# Patient Record
Sex: Female | Born: 1965 | Race: Asian | Hispanic: No | Marital: Married | Smoking: Never smoker
Health system: Southern US, Community
[De-identification: ages and names within clinical notes are randomized; demographics above are authoritative.]

## PROBLEM LIST (undated history)

## (undated) DIAGNOSIS — I10 Essential (primary) hypertension: Secondary | ICD-10-CM

## (undated) DIAGNOSIS — J3089 Other allergic rhinitis: Secondary | ICD-10-CM

## (undated) DIAGNOSIS — G43909 Migraine, unspecified, not intractable, without status migrainosus: Secondary | ICD-10-CM

## (undated) HISTORY — DX: Essential (primary) hypertension: I10

## (undated) HISTORY — DX: Migraine, unspecified, not intractable, without status migrainosus: G43.909

## (undated) HISTORY — DX: Other allergic rhinitis: J30.89

---

## 2009-12-12 ENCOUNTER — Emergency Department (HOSPITAL_COMMUNITY): Admission: EM | Admit: 2009-12-12 | Discharge: 2009-12-12 | Payer: Self-pay | Admitting: Family Medicine

## 2015-08-15 ENCOUNTER — Ambulatory Visit (INDEPENDENT_AMBULATORY_CARE_PROVIDER_SITE_OTHER): Payer: Managed Care, Other (non HMO) | Admitting: Family Medicine

## 2015-08-15 ENCOUNTER — Ambulatory Visit (INDEPENDENT_AMBULATORY_CARE_PROVIDER_SITE_OTHER): Payer: Managed Care, Other (non HMO)

## 2015-08-15 VITALS — BP 123/77 | HR 71 | Temp 97.8°F | Resp 16 | Ht 59.0 in | Wt 127.0 lb

## 2015-08-15 DIAGNOSIS — M25511 Pain in right shoulder: Secondary | ICD-10-CM

## 2015-08-15 DIAGNOSIS — M542 Cervicalgia: Secondary | ICD-10-CM

## 2015-08-15 DIAGNOSIS — S161XXA Strain of muscle, fascia and tendon at neck level, initial encounter: Secondary | ICD-10-CM | POA: Diagnosis not present

## 2015-08-15 DIAGNOSIS — R300 Dysuria: Secondary | ICD-10-CM

## 2015-08-15 DIAGNOSIS — N912 Amenorrhea, unspecified: Secondary | ICD-10-CM

## 2015-08-15 LAB — POCT URINALYSIS DIP (MANUAL ENTRY)
BILIRUBIN UA: NEGATIVE
Bilirubin, UA: NEGATIVE
GLUCOSE UA: NEGATIVE
Nitrite, UA: NEGATIVE
Protein Ur, POC: NEGATIVE
RBC UA: NEGATIVE
SPEC GRAV UA: 1.025
Urobilinogen, UA: 0.2
pH, UA: 6

## 2015-08-15 LAB — POCT URINE PREGNANCY: Preg Test, Ur: NEGATIVE

## 2015-08-15 LAB — POC MICROSCOPIC URINALYSIS (UMFC): MUCUS RE: ABSENT

## 2015-08-15 MED ORDER — MELOXICAM 7.5 MG PO TABS: 7.5000 mg | ORAL_TABLET | Freq: Every day | ORAL | Status: DC

## 2015-08-15 MED ORDER — METHOCARBAMOL 500 MG PO TABS: 500.0000 mg | ORAL_TABLET | Freq: Three times a day (TID) | ORAL | Status: DC | PRN

## 2015-08-15 MED ORDER — NITROFURANTOIN MONOHYD MACRO 100 MG PO CAPS
100.0000 mg | ORAL_CAPSULE | Freq: Two times a day (BID) | ORAL | Status: DC
Start: 2015-08-15 — End: 2015-12-05

## 2015-08-15 NOTE — Patient Instructions (Addendum)
We recommend that you schedule a mammogram for breast cancer screening. Typically, you do not need a referral to do this. Please contact a local imaging center to schedule your mammogram.  [facility] - [phone]  *ask for the Radiology Department [facility]) - [phone] or [phone]  [facility] - [phone] [facility] - [phone] [facility] - [phone]  *ask for the Radiology Department [facility] - [phone]  *ask for the Radiology Department [facility] - [phone]  *ask for the Mammography Department [facility] - [phone]      IF you received an x-ray today, you will receive an invoice from [facility]. Please contact  Radiology at [phone] with questions or concerns regarding your invoice.   IF you received labwork today, you will receive an invoice from [facility]. Please contact [facility] at [phone] with questions or concerns regarding your invoice.   Our billing staff will not be able to assist you with questions regarding bills from these companies.  You will be contacted with the lab results as soon as they are available. The fastest way to get your results is to activate your My Chart account. Instructions are located on the last page of this paperwork. If you have not heard from us regarding the results in 2 weeks, please contact this office.       Cervical Strain and Sprain With Rehab Cervical strain and sprain are injuries that commonly occur with "whiplash" injuries. Whiplash occurs when the neck is forcefully whipped backward or forward, such as during a motor vehicle accident or during contact sports. The muscles, ligaments, tendons, discs, and nerves of the neck are susceptible to injury when this occurs. RISK FACTORS Risk of having a whiplash injury increases  if:  Osteoarthritis of the spine.  Situations that make head or neck accidents or trauma more likely.  High-risk sports (football, rugby, wrestling, hockey, auto racing, gymnastics, diving, contact karate, or boxing).  Poor strength and flexibility of the neck.  Previous neck injury.  Poor tackling technique.  Improperly fitted or padded equipment. SYMPTOMS   Pain or stiffness in the front or back of neck or both.  Symptoms may present immediately or up to 24 hours after injury.  Dizziness, headache, nausea, and vomiting.  Muscle spasm with soreness and stiffness in the neck.  Tenderness and swelling at the injury site. PREVENTION  Learn and use proper technique (avoid tackling with the head, spearing, and head-butting; use proper falling techniques to avoid landing on the head).  Warm up and stretch properly before activity.  Maintain physical fitness:  Strength, flexibility, and endurance.  Cardiovascular fitness.  Wear properly fitted and padded protective equipment, such as padded soft collars, for participation in contact sports. PROGNOSIS  Recovery from cervical strain and sprain injuries is dependent on the extent of the injury. These injuries are usually curable in 1 week to 3 months with appropriate treatment.  RELATED COMPLICATIONS   Temporary numbness and weakness may occur if the nerve roots are damaged, and this may persist until the nerve has completely healed.  Chronic pain due to frequent recurrence of symptoms.  Prolonged healing, especially if activity is resumed too soon (before complete recovery). TREATMENT  Treatment initially involves the use of ice and medication to help reduce pain and inflammation. It is also important to perform strengthening and stretching exercises and modify activities that worsen symptoms so   the injury does not get worse. These exercises may be performed at home or with a therapist. For patients who experience severe  symptoms, a soft, padded collar may be recommended to be worn around the neck.  Improving your posture may help reduce symptoms. Posture improvement includes pulling your chin and abdomen in while sitting or standing. If you are sitting, sit in a firm chair with your buttocks against the back of the chair. While sleeping, try replacing your pillow with a small towel rolled to 2 inches in diameter, or use a cervical pillow or soft cervical collar. Poor sleeping positions delay healing.  For patients with nerve root damage, which causes numbness or weakness, the use of a cervical traction apparatus may be recommended. Surgery is rarely necessary for these injuries. However, cervical strain and sprains that are present at birth (congenital) may require surgery. MEDICATION   If pain medication is necessary, nonsteroidal anti-inflammatory medications, such as aspirin and ibuprofen, or other minor pain relievers, such as acetaminophen, are often recommended.  Do not take pain medication for 7 days before surgery.  Prescription pain relievers may be given if deemed necessary by your caregiver. Use only as directed and only as much as you need. HEAT AND COLD:   Cold treatment (icing) relieves pain and reduces inflammation. Cold treatment should be applied for 10 to 15 minutes every 2 to 3 hours for inflammation and pain and immediately after any activity that aggravates your symptoms. Use ice packs or an ice massage.  Heat treatment may be used prior to performing the stretching and strengthening activities prescribed by your caregiver, physical therapist, or athletic trainer. Use a heat pack or a warm soak. SEEK MEDICAL CARE IF:   Symptoms get worse or do not improve in 2 weeks despite treatment.  New, unexplained symptoms develop (drugs used in treatment may produce side effects). EXERCISES RANGE OF MOTION (ROM) AND STRETCHING EXERCISES - Cervical Strain and Sprain These exercises may help you when  beginning to rehabilitate your injury. In order to successfully resolve your symptoms, you must improve your posture. These exercises are designed to help reduce the forward-head and rounded-shoulder posture which contributes to this condition. Your symptoms may resolve with or without further involvement from your physician, physical therapist or athletic trainer. While completing these exercises, remember:   Restoring tissue flexibility helps normal motion to return to the joints. This allows healthier, less painful movement and activity.  An effective stretch should be held for at least 20 seconds, although you may need to begin with shorter hold times for comfort.  A stretch should never be painful. You should only feel a gentle lengthening or release in the stretched tissue. STRETCH- Axial Extensors  Lie on your back on the floor. You may bend your knees for comfort. Place a rolled-up hand towel or dish towel, about 2 inches in diameter, under the part of your head that makes contact with the floor.  Gently tuck your chin, as if trying to make a "double chin," until you feel a gentle stretch at the base of your head.  Hold __________ seconds. Repeat __________ times. Complete this exercise __________ times per day.  STRETCH - Axial Extension   Stand or sit on a firm surface. Assume a good posture: chest up, shoulders drawn back, abdominal muscles slightly tense, knees unlocked (if standing) and feet hip width apart.  Slowly retract your chin so your head slides back and your chin slightly lowers. Continue to look straight ahead.    You should feel a gentle stretch in the back of your head. Be certain not to feel an aggressive stretch since this can cause headaches later.  Hold for __________ seconds. Repeat __________ times. Complete this exercise __________ times per day. STRETCH - Cervical Side Bend   Stand or sit on a firm surface. Assume a good posture: chest up, shoulders drawn  back, abdominal muscles slightly tense, knees unlocked (if standing) and feet hip width apart.  Without letting your nose or shoulders move, slowly tip your right / left ear to your shoulder until your feel a gentle stretch in the muscles on the opposite side of your neck.  Hold __________ seconds. Repeat __________ times. Complete this exercise __________ times per day. STRETCH - Cervical Rotators   Stand or sit on a firm surface. Assume a good posture: chest up, shoulders drawn back, abdominal muscles slightly tense, knees unlocked (if standing) and feet hip width apart.  Keeping your eyes level with the ground, slowly turn your head until you feel a gentle stretch along the back and opposite side of your neck.  Hold __________ seconds. Repeat __________ times. Complete this exercise __________ times per day. RANGE OF MOTION - Neck Circles   Stand or sit on a firm surface. Assume a good posture: chest up, shoulders drawn back, abdominal muscles slightly tense, knees unlocked (if standing) and feet hip width apart.  Gently roll your head down and around from the back of one shoulder to the back of the other. The motion should never be forced or painful.  Repeat the motion 10-20 times, or until you feel the neck muscles relax and loosen. Repeat __________ times. Complete the exercise __________ times per day. STRENGTHENING EXERCISES - Cervical Strain and Sprain These exercises may help you when beginning to rehabilitate your injury. They may resolve your symptoms with or without further involvement from your physician, physical therapist, or athletic trainer. While completing these exercises, remember:   Muscles can gain both the endurance and the strength needed for everyday activities through controlled exercises.  Complete these exercises as instructed by your physician, physical therapist, or athletic trainer. Progress the resistance and repetitions only as guided.  You may  experience muscle soreness or fatigue, but the pain or discomfort you are trying to eliminate should never worsen during these exercises. If this pain does worsen, stop and make certain you are following the directions exactly. If the pain is still present after adjustments, discontinue the exercise until you can discuss the trouble with your clinician. STRENGTH - Cervical Flexors, Isometric  Face a wall, standing about 6 inches away. Place a small pillow, a ball about 6-8 inches in diameter, or a folded towel between your forehead and the wall.  Slightly tuck your chin and gently push your forehead into the soft object. Push only with mild to moderate intensity, building up tension gradually. Keep your jaw and forehead relaxed.  Hold 10 to 20 seconds. Keep your breathing relaxed.  Release the tension slowly. Relax your neck muscles completely before you start the next repetition. Repeat __________ times. Complete this exercise __________ times per day. STRENGTH- Cervical Lateral Flexors, Isometric   Stand about 6 inches away from a wall. Place a small pillow, a ball about 6-8 inches in diameter, or a folded towel between the side of your head and the wall.  Slightly tuck your chin and gently tilt your head into the soft object. Push only with mild to moderate intensity, building up tension gradually.   Keep your jaw and forehead relaxed.  Hold 10 to 20 seconds. Keep your breathing relaxed.  Release the tension slowly. Relax your neck muscles completely before you start the next repetition. Repeat __________ times. Complete this exercise __________ times per day. STRENGTH - Cervical Extensors, Isometric   Stand about 6 inches away from a wall. Place a small pillow, a ball about 6-8 inches in diameter, or a folded towel between the back of your head and the wall.  Slightly tuck your chin and gently tilt your head back into the soft object. Push only with mild to moderate intensity, building up  tension gradually. Keep your jaw and forehead relaxed.  Hold 10 to 20 seconds. Keep your breathing relaxed.  Release the tension slowly. Relax your neck muscles completely before you start the next repetition. Repeat __________ times. Complete this exercise __________ times per day. POSTURE AND BODY MECHANICS CONSIDERATIONS - Cervical Strain and Sprain Keeping correct posture when sitting, standing or completing your activities will reduce the stress put on different body tissues, allowing injured tissues a chance to heal and limiting painful experiences. The following are general guidelines for improved posture. Your physician or physical therapist will provide you with any instructions specific to your needs. While reading these guidelines, remember:  The exercises prescribed by your provider will help you have the flexibility and strength to maintain correct postures.  The correct posture provides the optimal environment for your joints to work. All of your joints have less wear and tear when properly supported by a spine with good posture. This means you will experience a healthier, less painful body.  Correct posture must be practiced with all of your activities, especially prolonged sitting and standing. Correct posture is as important when doing repetitive low-stress activities (typing) as it is when doing a single heavy-load activity (lifting). PROLONGED STANDING WHILE SLIGHTLY LEANING FORWARD When completing a task that requires you to lean forward while standing in one place for a long time, place either foot up on a stationary 2- to 4-inch high object to help maintain the best posture. When both feet are on the ground, the low back tends to lose its slight inward curve. If this curve flattens (or becomes too large), then the back and your other joints will experience too much stress, fatigue more quickly, and can cause pain.  RESTING POSITIONS Consider which positions are most painful for  you when choosing a resting position. If you have pain with flexion-based activities (sitting, bending, stooping, squatting), choose a position that allows you to rest in a less flexed posture. You would want to avoid curling into a fetal position on your side. If your pain worsens with extension-based activities (prolonged standing, working overhead), avoid resting in an extended position such as sleeping on your stomach. Most people will find more comfort when they rest with their spine in a more neutral position, neither too rounded nor too arched. Lying on a non-sagging bed on your side with a pillow between your knees, or on your back with a pillow under your knees will often provide some relief. Keep in mind, being in any one position for a prolonged period of time, no matter how correct your posture, can still lead to stiffness. WALKING Walk with an upright posture. Your ears, shoulders, and hips should all line up. OFFICE WORK When working at a desk, create an environment that supports good, upright posture. Without extra support, muscles fatigue and lead to excessive strain on joints and other   tissues. CHAIR:  A chair should be able to slide under your desk when your back makes contact with the back of the chair. This allows you to work closely.  The chair's height should allow your eyes to be level with the upper part of your monitor and your hands to be slightly lower than your elbows.  Body position:  Your feet should make contact with the floor. If this is not possible, use a foot rest.  Keep your ears over your shoulders. This will reduce stress on your neck and low back.   This information is not intended to replace advice given to you by your health care provider. Make sure you discuss any questions you have with your health care provider.   Document Released: 03/17/2005 Document Revised: 04/07/2014 Document Reviewed: 06/29/2008 Elsevier Interactive Patient Education 2016  Elsevier Inc[employer]  

## 2015-08-15 NOTE — Progress Notes (Signed)
Subjective:    Patient ID: Isabel QuarryAdeliza B Jupin, female    DOB: Dec 06, 1965, 50 y.o.   MRN: [id number]  08/15/2015  Neck Pain and Shoulder Pain   HPI This 50 y.o. female presents for evaluation of R posterior neck and shoudler pain.  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  Onset one week ago.  No radiation into arms.  No radiation into arms.  +tingling B hands chronically.  Applying heat.    LMP 7 months ago.  [facility]   Dysuria: onset one month ago.  Hematuria.  No flank pain; no n/v; +suprapubic pain.  No vaginal discharge or vaginal irritation; ;married.    Review of Systems  Constitutional: Negative for fever, chills, diaphoresis and fatigue.  Eyes: Negative for visual disturbance.  Respiratory: Negative for cough and shortness of breath.   Cardiovascular: Negative for chest pain, palpitations and leg swelling.  Gastrointestinal: Negative for nausea, vomiting, abdominal pain, diarrhea and constipation.  Endocrine: Negative for cold intolerance, heat intolerance, polydipsia, polyphagia and polyuria.  Genitourinary: Positive for dysuria, urgency and hematuria. Negative for frequency, flank pain, vaginal discharge and vaginal pain.  Musculoskeletal: Positive for myalgias, neck pain and neck stiffness.  Neurological: Negative for dizziness, tremors, seizures, syncope, facial asymmetry, speech difficulty, weakness, light-headedness, numbness and headaches.    History reviewed. No pertinent past medical history. Past Surgical History  Procedure Laterality Date  . Cesarean section     No Known Allergies  Social History   Social History  . Marital Status: Married    Spouse Name: N/A  . Number of Children: N/A  . Years of Education: N/A   Occupational History  . Not on file.   Social History Main Topics  . Smoking status: Never Smoker   . Smokeless tobacco: Not on file  . Alcohol Use: 0.0 oz/week    0 Standard drinks or equivalent per  week  . Drug Use: No  . Sexual Activity: Not on file   Other Topics Concern  . Not on file   Social History Narrative  . No narrative on file   History reviewed. No pertinent family history.     Objective:    BP 123/77 mmHg  Pulse 71  Temp(Src) 97.8 F (36.6 C)  Resp 16  Ht 4\' 11"  (1.499 m)  Wt 127 lb (57.607 kg)  BMI 25.64 kg/m2 Physical Exam  Constitutional: She is oriented to person, place, and time. She appears well-developed and well-nourished. No distress.  HENT:  Head: Normocephalic and atraumatic.  Right Ear: External ear normal.  Left Ear: External ear normal.  Nose: Nose normal.  Mouth/Throat: Oropharynx is clear and moist.  Eyes: Conjunctivae and EOM are normal. Pupils are equal, round, and reactive to light.  Neck: Normal range of motion. Neck supple. Carotid bruit is not present. No thyromegaly present.  Cardiovascular: Normal rate, regular rhythm, normal heart sounds and intact distal pulses.  Exam reveals no gallop and no friction rub.   No murmur heard. Pulmonary/Chest: Effort normal and breath sounds normal. She has no wheezes. She has no rales.  Abdominal: Soft. Bowel sounds are normal. She exhibits no distension and no mass. There is no tenderness. There is no rebound and no guarding.  Musculoskeletal:       Right shoulder: She exhibits normal range of motion, no tenderness, no bony tenderness, no swelling, no pain, no spasm, normal pulse and normal strength.       Cervical back: She exhibits decreased  range of motion, tenderness, pain and spasm. She exhibits no bony tenderness and normal pulse.  Lymphadenopathy:    She has no cervical adenopathy.  Neurological: She is alert and oriented to person, place, and time. No cranial nerve deficit.  Skin: Skin is warm and dry. No rash noted. She is not diaphoretic. No erythema. No pallor.  Psychiatric: She has a normal mood and affect. Her behavior is normal.        Assessment & Plan:   1. Neck pain on  right side   2. Pain in joint of right shoulder   3. Amenorrhea   4. Dysuria   5. Neck strain, initial encounter     Orders Placed This Encounter  Procedures  . Urine culture  . DG Cervical Spine Complete    Standing Status: Future     Number of Occurrences: 1     Standing Expiration Date: 08/14/2016    Order Specific Question:  Reason for Exam (SYMPTOM  OR DIAGNOSIS REQUIRED)    Answer:  R posterior neck pain and R posterior shoulder pain    Order Specific Question:  Is the patient pregnant?    Answer:  No    Order Specific Question:  Preferred imaging location?    Answer:  External  . DG Shoulder Right    Standing Status: Future     Number of Occurrences: 1     Standing Expiration Date: 08/14/2016    Order Specific Question:  Reason for Exam (SYMPTOM  OR DIAGNOSIS REQUIRED)    Answer:  R posterior neck pain and R posterior shoulder pain    Order Specific Question:  Is the patient pregnant?    Answer:  No    Order Specific Question:  Preferred imaging location?    Answer:  External  . POCT urine pregnancy  . POCT urinalysis dipstick  . POCT Microscopic Urinalysis ([facility])   Meds ordered this encounter  Medications  . lisinopril (PRINIVIL,ZESTRIL) 10 MG tablet    Sig: Take 10 mg by mouth daily.  . butalbital-acetaminophen-caffeine (FIORICET WITH CODEINE) 50-325-40-30 MG capsule    Sig: Take 1 capsule by mouth every 4 (four) hours as needed for headache.  . nitrofurantoin, macrocrystal-monohydrate, (MACROBID) 100 MG capsule    Sig: Take 1 capsule (100 mg total) by mouth 2 (two) times daily.    Dispense:  14 capsule    Refill:  0  . methocarbamol (ROBAXIN) 500 MG tablet    Sig: Take 1-2 tablets (500-1,000 mg total) by mouth every 8 (eight) hours as needed for muscle spasms.    Dispense:  30 tablet    Refill:  0  . meloxicam (MOBIC) 7.5 MG tablet    Sig: Take 1 tablet (7.5 mg total) by mouth daily.    Dispense:  30 tablet    Refill:  0    No Follow-up on  file.    Olesya Wike Paulita Fujita, M.D. [facility] [address] [place], [place]  [address] [phone] phone [phone] fax

## 2015-08-19 LAB — URINE CULTURE

## 2015-09-11 ENCOUNTER — Other Ambulatory Visit: Payer: Self-pay | Admitting: Family Medicine

## 2015-09-13 NOTE — Telephone Encounter (Signed)
Do you want to give RFs? 

## 2015-09-14 NOTE — Telephone Encounter (Signed)
One month supply of Meloxicam provided; will need referral or follow-up appointment for further refills.  Please advise patient.

## 2015-12-05 ENCOUNTER — Encounter: Payer: Self-pay | Admitting: Family Medicine

## 2015-12-05 ENCOUNTER — Ambulatory Visit (INDEPENDENT_AMBULATORY_CARE_PROVIDER_SITE_OTHER): Payer: Managed Care, Other (non HMO) | Admitting: Family Medicine

## 2015-12-05 VITALS — BP 120/74 | HR 62 | Temp 97.8°F | Resp 16 | Ht <= 58 in | Wt 127.8 lb

## 2015-12-05 DIAGNOSIS — IMO0001 Reserved for inherently not codable concepts without codable children: Secondary | ICD-10-CM

## 2015-12-05 DIAGNOSIS — H6002 Abscess of left external ear: Secondary | ICD-10-CM | POA: Diagnosis not present

## 2015-12-05 DIAGNOSIS — R51 Headache: Secondary | ICD-10-CM

## 2015-12-05 DIAGNOSIS — R05 Cough: Secondary | ICD-10-CM

## 2015-12-05 DIAGNOSIS — H9202 Otalgia, left ear: Secondary | ICD-10-CM

## 2015-12-05 DIAGNOSIS — T814XXA Infection following a procedure, initial encounter: Secondary | ICD-10-CM

## 2015-12-05 DIAGNOSIS — J069 Acute upper respiratory infection, unspecified: Secondary | ICD-10-CM

## 2015-12-05 DIAGNOSIS — R519 Headache, unspecified: Secondary | ICD-10-CM

## 2015-12-05 MED ORDER — GUAIFENESIN ER 1200 MG PO TB12: 1.0000 | ORAL_TABLET | Freq: Two times a day (BID) | ORAL | 1 refills | Status: DC | PRN

## 2015-12-05 MED ORDER — TIZANIDINE HCL 6 MG PO CAPS: 6.0000 mg | ORAL_CAPSULE | Freq: Three times a day (TID) | ORAL | 1 refills | Status: DC

## 2015-12-05 MED ORDER — DOXYCYCLINE HYCLATE 100 MG PO CAPS: 100.0000 mg | ORAL_CAPSULE | Freq: Two times a day (BID) | ORAL | 0 refills | Status: DC

## 2015-12-05 MED ORDER — BENZONATATE 100 MG PO CAPS: 100.0000 mg | ORAL_CAPSULE | Freq: Three times a day (TID) | ORAL | 0 refills | Status: DC | PRN

## 2015-12-05 MED ORDER — TOPIRAMATE 25 MG PO TABS: 50.0000 mg | ORAL_TABLET | Freq: Two times a day (BID) | ORAL | Status: DC

## 2015-12-05 NOTE — Patient Instructions (Addendum)
Start Topiramate for headache prevention at 25 mg at bedtime times 1 week. Increase to 50 mg (2 tablets) at bedtime until further evaluated by headache specialist.  Take tizanidine 6 mg up to 3 times daily as needed for headache.  Ear abscess Start Doxycycline 100 mg twice daily for 10 days.  Return for follow-up in 7 days to re-evaluate ear.   Cough and respiratory illness: . . benzonatate (TESSALON) 100 MG capsule    Sig: Take 1-2 capsules (100-200 mg total) by mouth 3 (three) times daily as needed for cough.    Dispense:  40 capsule  . Guaifenesin (MUCINEX MAXIMUM STRENGTH) 1200 MG TB12    Sig: Take 1 tablet (1,200 mg total) by mouth every 12 (twelve) hours as needed.   Return for follow-up in 4 weeks.  IF you received an x-ray today, you will receive an invoice from [facility]. Please contact [facility] at [phone] with questions or concerns regarding your invoice.   IF you received labwork today, you will receive an invoice from [employer]. Please contact [facility] at [phone] with questions or concerns regarding your invoice.   Our billing staff will not be able to assist you with questions regarding bills from these companies.  You will be contacted with the lab results as soon as they are available. The fastest way to get your results is to activate your My Chart account. Instructions are located on the last page of this paperwork. If you have not heard from [place] regarding the results in 2 weeks, please contact this office.    General Headache Without Cause A headache is pain or discomfort felt around the head or neck area. The specific cause of a headache may not be found. There are many causes and types of headaches. A few common ones are:  Tension headaches.  Migraine headaches.  Cluster headaches.  Chronic daily headaches. HOME CARE INSTRUCTIONS  Watch your condition for any changes. Take these steps to help with  your condition: Managing Pain  Take over-the-counter and prescription medicines only as told by your health care provider.  Lie down in a dark, quiet room when you have a headache.  If directed, apply ice to the head and neck area:  Put ice in a plastic bag.  Place a towel between your skin and the bag.  Leave the ice on for 20 minutes, 2-3 times per day.  Use a heating pad or hot shower to apply heat to the head and neck area as told by your health care provider.  Keep lights dim if bright lights bother you or make your headaches worse. Eating and Drinking  Eat meals on a regular schedule.  Limit alcohol use.  Decrease the amount of caffeine you drink, or stop drinking caffeine. General Instructions  Keep all follow-up visits as told by your health care provider. This is important.  Keep a headache journal to help find out what may trigger your headaches. For example, write down:  What you eat and drink.  How much sleep you get.  Any change to your diet or medicines.  Try massage or other relaxation techniques.  Limit stress.  Sit up straight, and do not tense your muscles.  Do not use tobacco products, including cigarettes, chewing tobacco, or e-cigarettes. If you need help quitting, ask your health care provider.  Exercise regularly as told by your health care provider.  Sleep on a regular schedule. Get 7-9 hours of sleep, or the amount recommended  by your health care provider. SEEK MEDICAL CARE IF:   Your symptoms are not helped by medicine.  You have a headache that is different from the usual headache.  You have nausea or you vomit.  You have a fever. SEEK IMMEDIATE MEDICAL CARE IF:   Your headache becomes severe.  You have repeated vomiting.  You have a stiff neck.  You have a loss of vision.  You have problems with speech.  You have pain in the eye or ear.  You have muscular weakness or loss of muscle control.  You lose your balance or  have trouble walking.  You feel faint or pass out.  You have confusion.   This information is not intended to replace advice given to you by your health care provider. Make sure you discuss any questions you have with your health care provider.   Document Released: 03/17/2005 Document Revised: 12/06/2014 Document Reviewed: 07/10/2014 Elsevier Interactive Patient Education [employer]2016 Elsevier Inc.

## 2015-12-05 NOTE — Progress Notes (Signed)
Patient ID: Isabel [name], female    DOB: 09/08/1965, 50 y.o.   MRN: [id number]  PCP: No PCP Per Patient  Chief Complaint  Patient presents with  . Other    left earache  started 12/04/15  . Cough    x 2 days  . Medication Refill    Triamcinolone 0.5%, Butalbital/Ace/Caff    Subjective:   HPI 50 year-old female presents for evaluation of coughing, headache, and left ear pain.  Cough Times three days. She hasn't taken any medicaiton  Sore and  Itchy throat Non-productive, dry, cough. Some shortness of breath and feeling fatigued.  Used water with lemon.  Reports subjective fever.  Headaches Two year history of headaches Average number of headaches per month 3-4 times. Forehead and occipital region.  Noticed at times when she experiences headaches when her blood pressure is elevated. She has never been evaluated for headaches.  Ear Pain Two days of soreness of outer ear canal around pimple in left ear.   Review of Systems  HENT: Positive for ear pain.        Left ear pustule superior to ear canal  Respiratory: Positive for cough.   Cardiovascular: Negative.   Neurological: Positive for headaches. Negative for dizziness.       SEE HPI   There are no active problems to display for this patient.    Prior to Admission medications   Medication Sig Start Date End Date Taking? Authorizing Provider  butalbital-acetaminophen-caffeine (FIORICET WITH CODEINE) 50-325-40-30 MG capsule Take 1 capsule by mouth every 4 (four) hours as needed for headache.   Yes Historical Provider, MD  lisinopril (PRINIVIL,ZESTRIL) 10 MG tablet Take 10 mg by mouth daily.   Yes Historical Provider, MD  meloxicam (MOBIC) 7.5 MG tablet TAKE 1 TABLET(7.5 MG) BY MOUTH DAILY 09/14/15  Yes Ethelda Chick, MD  methocarbamol (ROBAXIN) 500 MG tablet Take 1-2 tablets (500-1,000 mg total) by mouth every 8 (eight) hours as needed for muscle spasms. 08/15/15  Yes Ethelda Chick, MD     No Known Allergies    Objective:  Physical Exam  Constitutional: She is oriented to person, place, and time. She appears well-developed and well-nourished.  HENT:  Head: Normocephalic and atraumatic.  Right Ear: External ear normal.  Left Ear: External ear normal.  Nose: Nose normal.  Mouth/Throat: Oropharynx is clear and moist.  Drained pustule abscess-Verbal informed consent obtained.  Local anesthesia used 1% without epinephrine. Cleaned with alcohol swab.Prepped with betadine  Incised with # 11, drained purulent exudate. Wound culture obtained.  Eyes: Conjunctivae and EOM are normal. Pupils are equal, round, and reactive to light.  Neck: Normal range of motion.  Cardiovascular: Normal rate, regular rhythm, normal heart sounds and intact distal pulses.   Pulmonary/Chest: Effort normal and breath sounds normal.  Musculoskeletal: Normal range of motion.  Neurological: She is alert and oriented to person, place, and time.  Skin: Skin is warm and dry.  Psychiatric: She has a normal mood and affect. Her behavior is normal. Judgment and thought content normal.    Vitals:   12/05/15 1037  BP: 120/74  Pulse: 62  Resp: 16  Temp: 97.8 F (36.6 C)   Assessment & Plan:  1. Chronic intractable headache, unspecified headache type, unknown etiology.  -Start topiramate 50 mg 2 times daily. -tizanidine 6 mg, up to 3 times daily as needed. - AMB referral to headache clinic Follow-up in weeks for re-evaluation of treatment.  2. Wound abscess, initial encounter, in  left ear, vesicular, pustule, drained of purulent drainage. Patient tolerated. - Wound culture  Plan: -Treat with antibiotic prophylaxis against staph infection Doxycyline 100 mg twice daily, 10 days.  3. Viral upper respiratory illness, treat symptomatically. Plan: -Guaifenesin 1200 mg, twice daily -Benzonatate 100 mg-200 mg 3 times daily as needed for cough.  Godfrey PickKimberly S. Tiburcio PeaHarris, MSN, FNP-C [facility]

## 2015-12-08 LAB — WOUND CULTURE
GRAM STAIN: NONE SEEN
GRAM STAIN: NONE SEEN
Gram Stain: NONE SEEN
Organism ID, Bacteria: NORMAL

## 2015-12-28 ENCOUNTER — Ambulatory Visit (INDEPENDENT_AMBULATORY_CARE_PROVIDER_SITE_OTHER): Payer: Managed Care, Other (non HMO) | Admitting: Family Medicine

## 2015-12-28 ENCOUNTER — Ambulatory Visit (INDEPENDENT_AMBULATORY_CARE_PROVIDER_SITE_OTHER): Payer: Managed Care, Other (non HMO)

## 2015-12-28 VITALS — BP 124/80 | HR 77 | Temp 98.6°F | Resp 17 | Ht <= 58 in | Wt 123.0 lb

## 2015-12-28 DIAGNOSIS — R071 Chest pain on breathing: Secondary | ICD-10-CM

## 2015-12-28 DIAGNOSIS — R072 Precordial pain: Secondary | ICD-10-CM

## 2015-12-28 DIAGNOSIS — R0789 Other chest pain: Secondary | ICD-10-CM

## 2015-12-28 LAB — POCT CBC
Granulocyte percent: 54 %G (ref 37–80)
HCT, POC: 38.5 % (ref 37.7–47.9)
HEMOGLOBIN: 13.7 g/dL (ref 12.2–16.2)
LYMPH, POC: 1.9 (ref 0.6–3.4)
MCH: 32 pg — AB (ref 27–31.2)
MCHC: 35.6 g/dL — AB (ref 31.8–35.4)
MCV: 90.1 fL (ref 80–97)
MID (cbc): 0.3 (ref 0–0.9)
MPV: 7 fL (ref 0–99.8)
PLATELET COUNT, POC: 209 10*3/uL (ref 142–424)
POC Granulocyte: 2.5 (ref 2–6.9)
POC LYMPH PERCENT: 39.6 %L (ref 10–50)
POC MID %: 6.7 %M (ref 0–12)
RBC: 4.27 M/uL (ref 4.04–5.48)
RDW, POC: 12.5 %
WBC: 4.7 10*3/uL (ref 4.6–10.2)

## 2015-12-28 LAB — POCT SEDIMENTATION RATE: POCT SED RATE: 21 mm/h (ref 0–22)

## 2015-12-28 MED ORDER — PREDNISONE 10 MG PO TABS: ORAL_TABLET | ORAL | 0 refills | Status: DC

## 2015-12-28 MED ORDER — BENZONATATE 200 MG PO CAPS: 200.0000 mg | ORAL_CAPSULE | Freq: Three times a day (TID) | ORAL | 0 refills | Status: DC | PRN

## 2015-12-28 MED ORDER — MELOXICAM 7.5 MG PO TABS: 7.5000 mg | ORAL_TABLET | Freq: Every day | ORAL | 0 refills | Status: DC

## 2015-12-28 NOTE — Progress Notes (Signed)
By signing my name below I, Shelah Lewandowsky, attest that this documentation has been prepared under the direction and in the presence of Norberto Sorenson, MD. Electonically Signed. Shelah Lewandowsky, Scribe 12/28/2015 at 4:17 PM   Subjective:    Patient ID: Isabel [name], female    DOB: 1965-07-16, 50 y.o.   MRN: [id number]  Chief Complaint  Patient presents with  . Chest Pain    x 5 days     HPI Isabel [name] is a 50 y.o. female who presents to the [facility] complaining of a sharp stabbing substernal CP for the past 5 days. CP episodes will last for hours at a time. Pt has been taking aspirin with no relief. CP is exacerbated with deep breaths and movement. Upon deep breaths pt "feels chest muscles tight and moving." CP radiates to under left scapula. Pt denies any SOB, diaphoresis, nausea, GERD, abd pain, difficulty swallowing, or anxiety.   Pt has robaxin and mobic that pt has not been taking since initial onset of symptoms.   Pt reports recent cough and was seen and evaluated at [facility] on 12/05/15 and treated with 10 days doxycycline. Pt has had mild residual dry cough since treatment that has been improving.  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     There are no active problems to display for this patient.   Current Outpatient Prescriptions on File Prior to Visit  Medication Sig Dispense Refill  . benzonatate (TESSALON) 100 MG capsule Take 1-2 capsules (100-200 mg total) by mouth 3 (three) times daily as needed for cough. 40 capsule 0  . butalbital-acetaminophen-caffeine (FIORICET WITH CODEINE) 50-325-40-30 MG capsule Take 1 capsule by mouth every 4 (four) hours as needed for headache.    . doxycycline (VIBRAMYCIN) 100 MG capsule Take 1 capsule (100 mg total) by mouth 2 (two) times daily. 20 capsule 0  . Guaifenesin (MUCINEX MAXIMUM STRENGTH) 1200 MG TB12 Take 1 tablet (1,200 mg total) by mouth every 12 (twelve) hours as needed. 14  tablet 1  . lisinopril (PRINIVIL,ZESTRIL) 10 MG tablet Take 10 mg by mouth daily.    . meloxicam (MOBIC) 7.5 MG tablet TAKE 1 TABLET(7.5 MG) BY MOUTH DAILY 30 tablet 0  . methocarbamol (ROBAXIN) 500 MG tablet Take 1-2 tablets (500-1,000 mg total) by mouth every 8 (eight) hours as needed for muscle spasms. 30 tablet 0  . tizanidine (ZANAFLEX) 6 MG capsule Take 1 capsule (6 mg total) by mouth 3 (three) times daily. 30 capsule 1  . topiramate (TOPAMAX) 25 MG tablet Take 2 tablets (50 mg total) by mouth 2 (two) times daily. 60 tablet o   No current facility-administered medications on file prior to visit.     No Known Allergies  Depression screen [facility] 2/9 12/28/2015 12/05/2015 08/15/2015  Decreased Interest 0 0 0  Down, Depressed, Hopeless - 0 0  PHQ - 2 Score 0 0 0      Review of Systems  Constitutional: Negative.   HENT: Negative.   Eyes: Negative.   Respiratory: Positive for chest tightness. Negative for shortness of breath.   Cardiovascular: Positive for chest pain.  Gastrointestinal: Negative.   Genitourinary: Negative.   Musculoskeletal: Negative.   Skin: Negative.   Neurological: Negative for dizziness, light-headedness and headaches.  Psychiatric/Behavioral: Negative.        Objective:   Physical Exam  Constitutional: She is oriented to person, place, and time. She appears well-developed  and well-nourished. No distress.  HENT:  Head: Normocephalic and atraumatic.  Eyes: Conjunctivae are normal. Pupils are equal, round, and reactive to light.  Neck: Neck supple.  Cardiovascular: Normal rate, regular rhythm and normal heart sounds.  Exam reveals no gallop and no friction rub.   No murmur heard. Pulmonary/Chest: Effort normal and breath sounds normal. No accessory muscle usage. No respiratory distress. She has no decreased breath sounds. She has no wheezes. She has no rhonchi. She has no rales. She exhibits tenderness (costosternal junction, symptoms reproduced w/ palpation).    Abdominal: Soft. Normal appearance and bowel sounds are normal. She exhibits no distension. There is no hepatosplenomegaly. There is tenderness (mild) in the epigastric area. There is no rebound, no guarding, no CVA tenderness, no tenderness at McBurney's point and negative Murphy's sign.  Musculoskeletal: Normal range of motion.  Neurological: She is alert and oriented to person, place, and time.  Skin: Skin is warm and dry.  Psychiatric: She has a normal mood and affect. Her behavior is normal.  Nursing note and vitals reviewed.  BP 124/80 (BP Location: Right Arm, Patient Position: Sitting, Cuff Size: Normal)   Pulse 77   Temp 98.6 F (37 C) (Oral)   Resp 17   Ht 4\' 10"  (1.473 m)   Wt 123 lb (55.8 kg)   SpO2 99%   BMI 25.71 kg/m   EKG viewed and interpreted by Dr Clelia Croft. EKG shows normal sinus rhythm, lack of r wave in lead III with flipped t-waves. Question of early repolarization, no ischemic or acute changes. PR is 110.   Xray image viewed by Dr Clelia Croft. Dg Chest 2 View  Result Date: 12/28/2015 CLINICAL DATA:  Chest pain EXAM: CHEST  2 VIEW COMPARISON:  None FINDINGS: The heart size and mediastinal contours are within normal limits. Aortic atherosclerosis. Both lungs are clear. The visualized skeletal structures are unremarkable. IMPRESSION: No active cardiopulmonary disease. Electronically Signed   By: Signa Kell M.D.   On: 12/28/2015 16:11    Results for orders placed or performed in visit on 12/28/15  POCT CBC  Result Value Ref Range   WBC 4.7 4.6 - 10.2 K/uL   Lymph, poc 1.9 0.6 - 3.4   POC LYMPH PERCENT 39.6 10 - 50 %L   MID (cbc) 0.3 0 - 0.9   POC MID % 6.7 0 - 12 %M   POC Granulocyte 2.5 2 - 6.9   Granulocyte percent 54.0 37 - 80 %G   RBC 4.27 4.04 - 5.48 M/uL   Hemoglobin 13.7 12.2 - 16.2 g/dL   HCT, POC 40.9 81.1 - 47.9 %   MCV 90.1 80 - 97 fL   MCH, POC 32.0 (A) 27 - 31.2 pg   MCHC 35.6 (A) 31.8 - 35.4 g/dL   RDW, POC 91.4 %   Platelet Count, POC 209 142 -  424 K/uL   MPV 7.0 0 - 99.8 fL        Assessment & Plan:   1. Chest pain on breathing   2. Precordial pain   3. Costochondral chest pain     Orders Placed This Encounter  Procedures  . DG Chest 2 View    Standing Status:   Future    Number of Occurrences:   1    Standing Expiration Date:   12/27/2016    Order Specific Question:   Reason for Exam (SYMPTOM  OR DIAGNOSIS REQUIRED)    Answer:   central pleuritic chest pain radiating to left  scapula    Order Specific Question:   Is the patient pregnant?    Answer:   No    Order Specific Question:   Preferred imaging location?    Answer:   External  . POCT CBC  . POCT SEDIMENTATION RATE  . EKG 12-Lead    Meds ordered this encounter  Medications  . predniSONE (DELTASONE) 10 MG tablet    Sig: 6-5-4-3-2-1 tabs po qd    Dispense:  21 tablet    Refill:  0  . benzonatate (TESSALON) 200 MG capsule    Sig: Take 1 capsule (200 mg total) by mouth 3 (three) times daily as needed for cough.    Dispense:  60 capsule    Refill:  0  . meloxicam (MOBIC) 7.5 MG tablet    Sig: Take 1-2 tablets (7.5-15 mg total) by mouth daily.    Dispense:  60 tablet    Refill:  0    I personally performed the services described in this documentation, which was scribed in my presence. The recorded information has been reviewed and considered, and addended by me as needed.   Norberto SorensonEva Shaw, M.D.  [facility]  [place] [address] [place], [place] [address] [phone] phone [phone]) [phone] fax  12/31/15 1:51 PM

## 2015-12-28 NOTE — Patient Instructions (Addendum)
After your finish though course of prednisone, then restart the meloxicam daily (a refill was sent in). Do not use any other otc pain medication other than tylenol/acetaminophen - so no aleve, ibuprofen, motrin, advil, etc. Take the prednisone, then the meloxicam with breakfast every morning for the next month. Ice for 20 minutes every 2-3 hours. No lifting.  IF you received an x-ray today, you will receive an invoice from [facility]. Please contact [facility] at [phone] with questions or concerns regarding your invoice.   IF you received labwork today, you will receive an invoice from [employer]. Please contact [facility] at [phone] with questions or concerns regarding your invoice.   Our billing staff will not be able to assist you with questions regarding bills from these companies.  You will be contacted with the lab results as soon as they are available. The fastest way to get your results is to activate your My Chart account. Instructions are located on the last page of this paperwork. If you have not heard from [place] regarding the results in 2 weeks, please contact this office.    We recommend that you schedule a mammogram for breast cancer screening. Typically, you do not need a referral to do this. Please contact a local imaging center to schedule your mammogram.  [facility] - [phone]  *ask for the Radiology Department [facility]) - [phone] or [phone]  [facility] - [phone] [facility] - [phone] [facility] - [phone]  *ask for the Radiology Department [facility] - [phone]  *ask for the Radiology Department [facility] - [phone]  *ask for the Mammography Department [facility] - [phone]    Costochondritis Costochondritis, sometimes called Tietze syndrome, is a swelling  and irritation (inflammation) of the tissue (cartilage) that connects your ribs with your breastbone (sternum). It causes pain in the chest and rib area. Costochondritis usually goes away on its own over time. It can take up to 6 weeks or longer to get better, especially if you are unable to limit your activities. CAUSES  Some cases of costochondritis have no known cause. Possible causes include:  Injury (trauma).  Exercise or activity such as lifting.  Severe coughing. SIGNS AND SYMPTOMS  Pain and tenderness in the chest and rib area.  Pain that gets worse when coughing or taking deep breaths.  Pain that gets worse with specific movements. DIAGNOSIS  Your health care provider will do a physical exam and ask about your symptoms. Chest X-rays or other tests may be done to rule out other problems. TREATMENT  Costochondritis usually goes away on its own over time. Your health care provider may prescribe medicine to help relieve pain. HOME CARE INSTRUCTIONS   Avoid exhausting physical activity. Try not to strain your ribs during normal activity. This would include any activities using chest, abdominal, and side muscles, especially if heavy weights are used.  Apply ice to the affected area for the first 2 days after the pain begins.  Put ice in a plastic bag.  Place a towel between your skin and the bag.  Leave the ice on for 20 minutes, 2-3 times a day.  Only take over-the-counter or prescription medicines as directed by your health care provider. SEEK MEDICAL CARE IF:  You have redness or swelling at the rib joints. These are signs of infection.  Your pain does not go away despite rest  or medicine. SEEK IMMEDIATE MEDICAL CARE IF:   Your pain increases or you are very uncomfortable.  You have shortness of breath or difficulty breathing.  You cough up blood.  You have worse chest pains, sweating, or vomiting.  You have a fever or persistent symptoms for more than 2-3  days.  You have a fever and your symptoms suddenly get worse. MAKE SURE YOU:   Understand these instructions.  Will watch your condition.  Will get help right away if you are not doing well or get worse.   This information is not intended to replace advice given to you by your health care provider. Make sure you discuss any questions you have with your health care provider.   Document Released: 12/25/2004 Document Revised: 01/05/2013 Document Reviewed: 10/19/2012 Elsevier Interactive Patient Education [employer] Elsevier Inc.

## 2016-01-14 ENCOUNTER — Telehealth: Payer: Self-pay

## 2016-01-14 NOTE — Telephone Encounter (Signed)
Patients husband needs FMLA forms completed for his employer stating that he was out of work to care for his wife while she was having her Chest pain issues. I have completed what I could from the OV notes on 12/28/15 and I will place the forms in Dr Alver FisherShaw's box on 01/14/16 if you could complete the highlighted areas and return to the FMLA/Disability box at the 102 checkout desk within 5-7 business days. Thank you!

## 2016-01-15 NOTE — Telephone Encounter (Signed)
Completed. Will return form to fmla box

## 2016-01-18 NOTE — Telephone Encounter (Signed)
Paperwork scanned and faxed on 01/15/16 and again on 01/18/16 to [facility]

## 2016-01-22 DIAGNOSIS — Z0271 Encounter for disability determination: Secondary | ICD-10-CM

## 2016-05-14 ENCOUNTER — Ambulatory Visit: Payer: Managed Care, Other (non HMO)

## 2016-05-15 ENCOUNTER — Ambulatory Visit (INDEPENDENT_AMBULATORY_CARE_PROVIDER_SITE_OTHER): Payer: Managed Care, Other (non HMO) | Admitting: Physician Assistant

## 2016-05-15 VITALS — BP 110/78 | HR 74 | Temp 98.4°F | Ht 58.25 in | Wt 129.0 lb

## 2016-05-15 DIAGNOSIS — Z23 Encounter for immunization: Secondary | ICD-10-CM

## 2016-05-15 DIAGNOSIS — Z1211 Encounter for screening for malignant neoplasm of colon: Secondary | ICD-10-CM

## 2016-05-15 DIAGNOSIS — L237 Allergic contact dermatitis due to plants, except food: Secondary | ICD-10-CM | POA: Diagnosis not present

## 2016-05-15 DIAGNOSIS — H9191 Unspecified hearing loss, right ear: Secondary | ICD-10-CM | POA: Diagnosis not present

## 2016-05-15 DIAGNOSIS — G43909 Migraine, unspecified, not intractable, without status migrainosus: Secondary | ICD-10-CM | POA: Insufficient documentation

## 2016-05-15 DIAGNOSIS — Z7689 Persons encountering health services in other specified circumstances: Secondary | ICD-10-CM

## 2016-05-15 DIAGNOSIS — I10 Essential (primary) hypertension: Secondary | ICD-10-CM | POA: Diagnosis not present

## 2016-05-15 DIAGNOSIS — G43109 Migraine with aura, not intractable, without status migrainosus: Secondary | ICD-10-CM | POA: Diagnosis not present

## 2016-05-15 DIAGNOSIS — M62838 Other muscle spasm: Secondary | ICD-10-CM | POA: Diagnosis not present

## 2016-05-15 DIAGNOSIS — H9190 Unspecified hearing loss, unspecified ear: Secondary | ICD-10-CM | POA: Insufficient documentation

## 2016-05-15 MED ORDER — TIZANIDINE HCL 6 MG PO CAPS: 6.0000 mg | ORAL_CAPSULE | Freq: Three times a day (TID) | ORAL | 1 refills | Status: DC

## 2016-05-15 MED ORDER — TRIAMCINOLONE ACETONIDE 0.5 % EX CREA: 1.0000 "application " | TOPICAL_CREAM | Freq: Two times a day (BID) | CUTANEOUS | 1 refills | Status: DC

## 2016-05-15 MED ORDER — LISINOPRIL 20 MG PO TABS: 20.0000 mg | ORAL_TABLET | Freq: Every day | ORAL | 3 refills | Status: DC

## 2016-05-15 MED ORDER — TOPIRAMATE 50 MG PO TABS: 50.0000 mg | ORAL_TABLET | Freq: Two times a day (BID) | ORAL | 3 refills | Status: DC

## 2016-05-15 NOTE — Patient Instructions (Signed)
     IF you received an x-ray today, you will receive an invoice from [facility]. Please contact [facility] at [phone] with questions or concerns regarding your invoice.   IF you received labwork today, you will receive an invoice from [facility]. Please contact [facility] at [phone] with questions or concerns regarding your invoice.   Our billing staff will not be able to assist you with questions regarding bills from these companies.  You will be contacted with the lab results as soon as they are available. The fastest way to get your results is to activate your My Chart account. Instructions are located on the last page of this paperwork. If you have not heard from us regarding the results in 2 weeks, please contact this office.     

## 2016-05-15 NOTE — Progress Notes (Signed)
Patient ID: Isabel [name], female    DOB: 21-Apr-1965, 51 y.o.   MRN: [id number]  PCP: No PCP Per Patient  Chief Complaint  Patient presents with  . Otalgia    left ear hard to hear  . Hypertension    reg MD office closed - needs refill Lisinopril  . Medication Refill    Triamcinolone for skin allergy, zanaflex, topamax  . history of migraines    Subjective:   Presents to establish care, for medication refill, and for otalgia.  Pt is a 51 yo Asian female who presents to establish care, for medication refill, and otalgia. Pt states that her previous PCP closed unexpectedly a few weeks ago. She would like to establish care at [facility] at [facility].   Hypertension: Pt needs a refill of her Lisinopril. Pt takes as directed. Denies changes in vision, cough, SOB, chest pain, or palpitations.  Seasonal Allergies: Pt needs refill of Kenalog cream. She takes as needed for pollen-induced allergic dermatitis. Symptoms are well controlled with use of medication.   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 Had a benign EKG and chest xray on 12/28/15. Symptoms are well controlled with use of medication.   Migraines: Pt needs refill of Topiramate that she takes daily for migraine with aura prophylaxis. Pt takes medication as directed. Denies dizziness, photophobia, phonophobia, worst headache of life, motor or sensory deficits. Symptoms are well controlled on medication.  Otalgia: Pt states that she has had gradually worsening decreased hearing in her right ear for several years. She has been seen for it in the past and was given cerumen-softening drops that did not relieve the hearing loss. Denies pain, fullness, discharge, vertigo, or tinnitus.   Pt also requests flu shot today.    Review of Systems In addition to that stated in HPI above: Const: Denies fever, chills, fatigue,  or weight changes. Abd: Denies abdominal pain, nausea, vomiting, diarrhea, or constipation. GU: Denies dysuria, frequency, or urgency.  There are no active problems to display for this patient.    Prior to Admission medications   Medication Sig Start Date End Date Taking? Authorizing Provider  lisinopril (PRINIVIL,ZESTRIL) 10 MG tablet Take 20 mg by mouth daily.    Yes Historical Provider, MD  meloxicam (MOBIC) 7.5 MG tablet Take 1-2 tablets (7.5-15 mg total) by mouth daily. 12/28/15  Yes Sherren Mocha, MD  tizanidine (ZANAFLEX) 6 MG capsule Take 1 capsule (6 mg total) by mouth 3 (three) times daily. 12/05/15  Yes Doyle Askew, FNP  topiramate (TOPAMAX) 25 MG tablet Take 2 tablets (50 mg total) by mouth 2 (two) times daily. 12/05/15  Yes Doyle Askew, FNP  benzonatate (TESSALON) 200 MG capsule Take 1 capsule (200 mg total) by mouth 3 (three) times daily as needed for cough. Patient not taking: Reported on 05/15/2016 12/28/15   Sherren Mocha, MD  Guaifenesin [facility] MAXIMUM STRENGTH) 1200 MG TB12 Take 1 tablet (1,200 mg total) by mouth every 12 (twelve) hours as needed. Patient not taking: Reported on 05/15/2016 12/05/15   Doyle Askew, FNP     No Known Allergies     Objective:  Physical Exam HEENT: PERRLA. Throat non-erythematous, no exudates. Ear canals clear bilaterally, TM's intact, non-bulging. Questionable mild retraction of right TM, cone of light is disrupted. No lymphadenopathy, noi tenderness to palpation of neck. Pulm:  Good respiratory effort. CTAB. No wheezes, rales, or rhonchi. CV: RRR. No M/R/G. Abd: Soft, non-tender, non-distended.+ BS x 4 quadrants. Neuro: A&O x 3. Psych: Proper affect.    Assessment & Plan:   1. Encounter to establish care Pt advised to make an appointment for a complete physical exam where we will address other screening such as pap smear.  2. Hearing loss of right ear, unspecified hearing loss type May have to do with  Eustachian tube dysfunction, but long standing. Referral to ENT for further assessment.  - Ambulatory referral to ENT  3. Migraine with aura and without status migrainosus, not intractable Continue Topamax as directed. - topiramate (TOPAMAX) 50 MG tablet; Take 1 tablet (50 mg total) by mouth 2 (two) times daily.  Dispense: 180 tablet; Refill: 3  4. Essential hypertension Continue Lisinopril as directed.  - lisinopril (PRINIVIL,ZESTRIL) 20 MG tablet; Take 1 tablet (20 mg total) by mouth daily.  Dispense: 90 tablet; Refill: 3  5. Muscle spasm Continue Zanaflex as needed for chest wall muscle spasms. - tizanidine (ZANAFLEX) 6 MG capsule; Take 1 capsule (6 mg total) by mouth 3 (three) times daily.  Dispense: 30 capsule; Refill: 1  6. Allergic contact dermatitis due to pollen Continue Kenalog cream as needed for dermatitis.  - triamcinolone cream (KENALOG) 0.5 %; Apply 1 application topically 2 (two) times daily. Apply to affected area twice a day as needed.  Dispense: 30 g; Refill: 1  7. Need for influenza vaccination Pt requests flu shot today.  - Flu Vaccine QUAD 36+ mos IM  8. Screening for colon cancer Pt requests colonoscopy referral today instead of waiting for her physical.  - Ambulatory referral to Gastroenterology  Georgiana SpinnerHannah Bradley Alireza Pollack, PA-S

## 2016-05-15 NOTE — Progress Notes (Signed)
Patient ID: Isabel [name], female     DOB: 1965/08/12, 51 y.o.    MRN: [id number]  PCP: No PCP Per Patient  Chief Complaint  Patient presents with  . Otalgia    left ear hard to hear  . Hypertension    reg MD office closed - needs refill Lisinopril  . Medication Refill    Triamcinolone for skin allergy, zanaflex, topamax  . history of migraines    Subjective:   This patient presents for medication refills and to establish care. Her PCP closed the practice suddenly. She has been here for several acute issues and had a good experience. She desires to establish for primary care. She is accompanied by her husband.  Tolerates her current medications well without adverse effects.  Uses steroid cream PRN for dermatitis related to allergies.  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 When she presented for evaluation of that initially, EKG and CXR were both normal. PRN tizanidine works well.   Topiramate provides good migraine prophylaxis. RIGHT ear hearing loss has been progressing over several years. 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 With resolution of the cerumenosis, her hearing did not improve. No tinnitus, dizziness, ear pain or fullness.  Review of Systems As above.  Prior to Admission medications   Medication Sig Start Date End Date Taking? Authorizing Provider  lisinopril (PRINIVIL,ZESTRIL) 20 MG tablet Take 1 tablet (20 mg total) by mouth daily. 05/15/16  Yes Alzada Brazee, PA-C  meloxicam (MOBIC) 7.5 MG tablet Take 1-2 tablets (7.5-15 mg total) by mouth daily. 12/28/15  Yes Sherren MochaEva N Shaw, MD  tizanidine (ZANAFLEX) 6 MG capsule Take 1 capsule (6 mg total) by mouth 3 (three) times daily. 05/15/16  Yes Ian Cavey, PA-C  topiramate (TOPAMAX) 50 MG tablet Take 1 tablet (50 mg total) by mouth 2 (two) times daily. 05/15/16  Yes  Merek Niu, PA-C  triamcinolone cream (KENALOG) 0.5 % Apply 1 application topically 2 (two) times daily. Apply to affected area twice a day as needed. 05/15/16  Yes Myldred Raju, PA-C  diphenhydrAMINE (BENADRYL) 12.5 MG chewable tablet Chew 12.5 mg by mouth as needed.    Historical Provider, MD  Guaifenesin (MUCINEX MAXIMUM STRENGTH) 1200 MG TB12 Take 1 tablet (1,200 mg total) by mouth every 12 (twelve) hours as needed. Patient not taking: Reported on 05/15/2016 12/05/15   Doyle AskewKimberly Stephenia Harris, FNP     No Known Allergies   Patient Active Problem List   Diagnosis Date Noted  . Migraine 05/15/2016  . Essential hypertension 05/15/2016  . Allergic contact dermatitis due to pollen 05/15/2016  . Muscle spasm 05/15/2016  . Hearing loss 05/15/2016     Family History  Problem Relation Age of Onset  . Lung cancer Mother   . CAD Father   . Hypertension Father   . Hypertension Sister   . CAD Brother      Social History   Social History  . Marital status: Married    Spouse name: Isabel [name]  . Number of children: 2  . Years of education: High School   Occupational History  . Server     [employer]   Social History Main Topics  . Smoking status: Never Smoker  . Smokeless tobacco: Never Used  . Alcohol use 0.0 oz/week  . Drug use: No  . Sexual activity: Yes  Partners: Male    Birth control/ protection: None   Other Topics Concern  . Not on file   Social History Narrative   Lives with husband and two adult children.          Objective:  Physical Exam  Constitutional: She is oriented to person, place, and time. She appears well-developed and well-nourished. She is active and cooperative. No distress.  BP 110/78 (BP Location: Left Arm, Cuff Size: Large)   Pulse 74   Temp 98.4 F (36.9 C) (Oral)   Ht 4' 10.25" (1.48 m)   Wt 129 lb (58.5 kg)   SpO2 98%   BMI 26.73 kg/m   HENT:  Head: Normocephalic and atraumatic.  Right Ear: Hearing, external ear and ear  canal normal. Tympanic membrane is retracted (retraction pocket, 6 o'clock to 9 o'clock, dirupts cone of light).  Left Ear: Hearing, tympanic membrane, external ear and ear canal normal.  Nose: Nose normal.  Mouth/Throat: Uvula is midline, oropharynx is clear and moist and mucous membranes are normal.  Eyes: Conjunctivae are normal. No scleral icterus.  Neck: Normal range of motion, full passive range of motion without pain and phonation normal. Neck supple. No thyromegaly present.  Cardiovascular: Normal rate, regular rhythm and normal heart sounds.   Pulses:      Radial pulses are 2+ on the right side, and 2+ on the left side.  Pulmonary/Chest: Effort normal and breath sounds normal.  Lymphadenopathy:       Head (right side): No tonsillar, no preauricular, no posterior auricular and no occipital adenopathy present.       Head (left side): No tonsillar, no preauricular, no posterior auricular and no occipital adenopathy present.    She has no cervical adenopathy.       Right: No supraclavicular adenopathy present.       Left: No supraclavicular adenopathy present.  Neurological: She is alert and oriented to person, place, and time. No sensory deficit.  Skin: Skin is warm, dry and intact. No rash noted. No cyanosis or erythema. Nails show no clubbing.  Psychiatric: She has a normal mood and affect. Her speech is normal and behavior is normal.       Assessment & Plan:  1. Encounter to establish care She will schedule a wellness exam with the provider of her choice.  2. Hearing loss of right ear, unspecified hearing loss type Possibly due to RIGHT middle ear effusion. Given progression and chronic nature, refer to ENT. - Ambulatory referral to ENT  3. Migraine with aura and without status migrainosus, not intractable Stable.  - topiramate (TOPAMAX) 50 MG tablet; Take 1 tablet (50 mg total) by mouth 2 (two) times daily.  Dispense: 180 tablet; Refill: 3  4. Essential  hypertension Controlled. - lisinopril (PRINIVIL,ZESTRIL) 20 MG tablet; Take 1 tablet (20 mg total) by mouth daily.  Dispense: 90 tablet; Refill: 3  5. Muscle spasm Stable. - tizanidine (ZANAFLEX) 6 MG capsule; Take 1 capsule (6 mg total) by mouth 3 (three) times daily.  Dispense: 30 capsule; Refill: 1  6. Allergic contact dermatitis due to pollen Stable. - triamcinolone cream (KENALOG) 0.5 %; Apply 1 application topically 2 (two) times daily. Apply to affected area twice a day as needed.  Dispense: 30 g; Refill: 1  7. Need for influenza vaccination - Flu Vaccine QUAD 36+ mos IM  8. Screening for colon cancer - Ambulatory referral to Gastroenterology   Return for wellness exam.   Fernande Bras, PA-C Physician Assistant-Certified Primary  [facility] at [place]

## 2016-06-12 ENCOUNTER — Ambulatory Visit (INDEPENDENT_AMBULATORY_CARE_PROVIDER_SITE_OTHER): Payer: Managed Care, Other (non HMO) | Admitting: Physician Assistant

## 2016-06-12 ENCOUNTER — Encounter: Payer: Self-pay | Admitting: Physician Assistant

## 2016-06-12 VITALS — BP 128/78 | HR 68 | Temp 98.4°F | Ht 58.25 in | Wt 129.0 lb

## 2016-06-12 DIAGNOSIS — H9209 Otalgia, unspecified ear: Secondary | ICD-10-CM

## 2016-06-12 MED ORDER — AMOXICILLIN 875 MG PO TABS: 875.0000 mg | ORAL_TABLET | Freq: Two times a day (BID) | ORAL | 0 refills | Status: DC

## 2016-06-12 NOTE — Patient Instructions (Addendum)
Please come back in about 3 weeks when you are well.  If you are not feeling better on Saturday then please come back at that time.       IF you received an x-ray today, you will receive an invoice from [facility]. Please contact [facility] at [phone] with questions or concerns regarding your invoice.   IF you received labwork today, you will receive an invoice from [facility]. Please contact [facility] at [phone] with questions or concerns regarding your invoice.   Our billing staff will not be able to assist you with questions regarding bills from these companies.  You will be contacted with the lab results as soon as they are available. The fastest way to get your results is to activate your My Chart account. Instructions are located on the last page of this paperwork. If you have not heard from us regarding the results in 2 weeks, please contact this office.

## 2016-06-12 NOTE — Progress Notes (Signed)
06/12/2016 8:47 AM   DOB: 1965-12-22 / MRN: [id number]  SUBJECTIVE:  Isabel [name] is a 51 y.o. female presenting for pulsing right sided earache.  Assoiceates right sided sore throat and generalized non pulsatile right sided HA that started 5 days ago.  She fees that she is getting worse.  Has tried multiple OTC preps with transient relief. She does complain of some mild chronic decreased hearing in the right side.    She has No Known Allergies.   She  has a past medical history of Environmental and seasonal allergies; Hypertension; and Migraines.    She  reports that she has never smoked. She has never used smokeless tobacco. She reports that she drinks alcohol. She reports that she does not use drugs. She  reports that she currently engages in sexual activity and has had female partners. She reports using the following method of birth control/protection: None. The patient  has a past surgical history that includes Cesarean section.  Her family history includes CAD in her brother and father; Hypertension in her father and sister; Lung cancer in her mother.  Review of Systems  Constitutional: Positive for fever (subjective).  HENT: Positive for hearing loss, sore throat and tinnitus. Negative for congestion.   Eyes: Negative.   Respiratory: Negative for cough.   Gastrointestinal: Negative for abdominal pain and nausea.    The problem list and medications were reviewed and updated by myself where necessary and exist elsewhere in the encounter.   OBJECTIVE:  BP 128/78 (BP Location: Right Arm, Patient Position: Sitting, Cuff Size: Normal)   Pulse 68   Temp 98.4 F (36.9 C) (Oral)   Ht 4' 10.25" (1.48 m)   Wt 129 lb (58.5 kg)   SpO2 97%   BMI 26.73 kg/m   Physical Exam  Constitutional: She is oriented to person, place, and time. She appears well-developed and well-nourished. No distress.  HENT:  Right Ear: No drainage, swelling or tenderness. No mastoid tenderness. Tympanic  membrane is scarred. Tympanic membrane is not injected, not perforated and not erythematous. Decreased hearing is noted.  Left Ear: Tympanic membrane normal. No decreased hearing is noted.  Ears:  Cardiovascular: Normal rate and regular rhythm.   Pulmonary/Chest: Effort normal and breath sounds normal.  Musculoskeletal: Normal range of motion.  Neurological: She is alert and oriented to person, place, and time. She has normal reflexes.  Skin: She is not diaphoretic.    No results found for this or any previous visit (from the past 72 hour(s)).  No results found.  ASSESSMENT AND PLAN:  Isabel [name] was seen today for otalgia, sore throat and headache.  Diagnoses and all orders for this visit:  Ear ache: Weber and Renee both postivie for conductive hearing loss.  HPI consistent with a otitis media.  No tenderness with manipulation of the ear and no perforation on exam.  Will treat this as an otitis media and she will come back in on Saturday if you are not feeling better. She will return in three weeks for a recheck of her hearing. -     amoxicillin (AMOXIL) 875 MG tablet; Take 1 tablet (875 mg total) by mouth 2 (two) times daily.    The patient is advised to call or return to clinic if she does not see an improvement in symptoms, or to seek the care of the closest emergency department if she worsens with the above plan.   Deliah BostonMichael Jnai Snellgrove, MHS, PA-C [facility] 06/12/2016 8:47 AM

## 2016-06-18 ENCOUNTER — Encounter: Payer: Self-pay | Admitting: Physician Assistant

## 2016-06-18 ENCOUNTER — Ambulatory Visit (INDEPENDENT_AMBULATORY_CARE_PROVIDER_SITE_OTHER): Payer: Managed Care, Other (non HMO) | Admitting: Physician Assistant

## 2016-06-18 VITALS — BP 128/80 | HR 78 | Temp 98.0°F | Resp 17 | Ht 58.25 in | Wt 127.0 lb

## 2016-06-18 DIAGNOSIS — H9311 Tinnitus, right ear: Secondary | ICD-10-CM | POA: Diagnosis not present

## 2016-06-18 DIAGNOSIS — H9209 Otalgia, unspecified ear: Secondary | ICD-10-CM

## 2016-06-18 MED ORDER — LEVOCETIRIZINE DIHYDROCHLORIDE 5 MG PO TABS: 5.0000 mg | ORAL_TABLET | Freq: Every evening | ORAL | 0 refills | Status: DC

## 2016-06-18 NOTE — Patient Instructions (Addendum)
Continue amox.  Start taking xyzal tonight.  If you are still having symptoms 10 days from today then call and I will get you a referral to ENT for further evaluation of hearing loss and tinnitus.     IF you received an x-ray today, you will receive an invoice from [facility]. Please contact [facility] at [phone] with questions or concerns regarding your invoice.   IF you received labwork today, you will receive an invoice from [facility]. Please contact [facility] at [phone] with questions or concerns regarding your invoice.   Our billing staff will not be able to assist you with questions regarding bills from these companies.  You will be contacted with the lab results as soon as they are available. The fastest way to get your results is to activate your My Chart account. Instructions are located on the last page of this paperwork. If you have not heard from us regarding the results in 2 weeks, please contact this office.

## 2016-06-18 NOTE — Progress Notes (Signed)
  06/18/2016 3:39 PM   DOB: [birth year] / MRN: [id number]  SUBJECTIVE:  Isabel [name] is a 51 y.o. female presenting for recheck of right sided ear infection. She tells me the pain is now gone but she is having some tinnitus on that same side.  She had some decreased hear and scarring on the right side.  Today she failed the whisper test on the right side. She does have some sneezing and eye itching at this time.    She has No Known Allergies.   She  has a past medical history of Environmental and seasonal allergies; Hypertension; and Migraines.    She  reports that she has never smoked. She has never used smokeless tobacco. She reports that she drinks alcohol. She reports that she does not use drugs. She  reports that she currently engages in sexual activity and has had female partners. She reports using the following method of birth control/protection: None. The patient  has a past surgical history that includes Cesarean section.  Her family history includes CAD in her brother and father; Hypertension in her father and sister; Lung cancer in her mother.  Review of Systems  Constitutional: Negative for chills and fever.  Gastrointestinal: Negative for nausea.  Neurological: Negative for dizziness.    The problem list and medications were reviewed and updated by myself where necessary and exist elsewhere in the encounter.   OBJECTIVE:  BP 128/80 (BP Location: Right Arm, Patient Position: Sitting, Cuff Size: Normal)   Pulse 78   Temp 98 F (36.7 C) (Oral)   Resp 17   Ht 4' 10.25" (1.48 m)   Wt 127 lb (57.6 kg)   SpO2 97%   BMI 26.32 kg/m   Physical Exam  Constitutional: She is oriented to person, place, and time.  HENT:  Right Ear: No lacerations. No drainage, swelling or tenderness. No foreign bodies. No mastoid tenderness. Tympanic membrane is injected. Tympanic membrane is not scarred, not perforated, not erythematous, not retracted and not bulging. Tympanic membrane mobility is  normal. No middle ear effusion. No hemotympanum. Decreased hearing is noted.  Left Ear: Tympanic membrane and external ear normal.  Cardiovascular: Normal rate, regular rhythm and normal heart sounds.   Pulmonary/Chest: Effort normal and breath sounds normal.  Musculoskeletal: Normal range of motion.  Neurological: She is alert and oriented to person, place, and time.  Skin: Skin is warm and dry.    No results found for this or any previous visit (from the past 72 hour(s)).  No results found.  ASSESSMENT AND PLAN:  [name] was seen today for follow-up.  Diagnoses and all orders for this visit:  Ear ache: She may be having allergic symptoms that are causing swelling in the ET tube.  Will start histamine blocker and she will call back in about 10 days after completing amox and I will refer her to ENT for further evaluation and management.  -     levocetirizine (XYZAL) 5 MG tablet; Take 1 tablet (5 mg total) by mouth every evening.  Tinnitus of right ear    The patient is advised to call or return to clinic if she does not see an improvement in symptoms, or to seek the care of the closest emergency department if she worsens with the above plan.   Deliah BostonMichael Clark, MHS, PA-C [facility] 06/18/2016 3:39 PM

## 2016-08-05 ENCOUNTER — Encounter: Payer: Self-pay | Admitting: Physician Assistant

## 2016-08-05 ENCOUNTER — Ambulatory Visit (INDEPENDENT_AMBULATORY_CARE_PROVIDER_SITE_OTHER): Payer: Managed Care, Other (non HMO) | Admitting: Physician Assistant

## 2016-08-05 VITALS — BP 124/74 | HR 81 | Temp 98.2°F | Resp 18 | Ht <= 58 in | Wt 125.4 lb

## 2016-08-05 DIAGNOSIS — Z13220 Encounter for screening for lipoid disorders: Secondary | ICD-10-CM | POA: Diagnosis not present

## 2016-08-05 DIAGNOSIS — Z23 Encounter for immunization: Secondary | ICD-10-CM | POA: Diagnosis not present

## 2016-08-05 DIAGNOSIS — L237 Allergic contact dermatitis due to plants, except food: Secondary | ICD-10-CM | POA: Diagnosis not present

## 2016-08-05 DIAGNOSIS — Z1211 Encounter for screening for malignant neoplasm of colon: Secondary | ICD-10-CM | POA: Diagnosis not present

## 2016-08-05 DIAGNOSIS — M2550 Pain in unspecified joint: Secondary | ICD-10-CM

## 2016-08-05 DIAGNOSIS — I10 Essential (primary) hypertension: Secondary | ICD-10-CM

## 2016-08-05 DIAGNOSIS — Z0000 Encounter for general adult medical examination without abnormal findings: Secondary | ICD-10-CM

## 2016-08-05 DIAGNOSIS — J301 Allergic rhinitis due to pollen: Secondary | ICD-10-CM | POA: Diagnosis not present

## 2016-08-05 DIAGNOSIS — Z124 Encounter for screening for malignant neoplasm of cervix: Secondary | ICD-10-CM | POA: Diagnosis not present

## 2016-08-05 LAB — POCT URINALYSIS DIP (MANUAL ENTRY)
BILIRUBIN UA: NEGATIVE
BILIRUBIN UA: NEGATIVE mg/dL
Blood, UA: NEGATIVE
GLUCOSE UA: NEGATIVE mg/dL
LEUKOCYTES UA: NEGATIVE
Nitrite, UA: NEGATIVE
Protein Ur, POC: NEGATIVE mg/dL
SPEC GRAV UA: 1.01 (ref 1.010–1.025)
Urobilinogen, UA: 0.2 E.U./dL
pH, UA: 6.5 (ref 5.0–8.0)

## 2016-08-05 MED ORDER — AZELASTINE HCL 0.15 % NA SOLN: 2.0000 | Freq: Two times a day (BID) | NASAL | 0 refills | Status: DC

## 2016-08-05 MED ORDER — MELOXICAM 7.5 MG PO TABS: 7.5000 mg | ORAL_TABLET | Freq: Every day | ORAL | 1 refills | Status: AC

## 2016-08-05 MED ORDER — TRIAMCINOLONE ACETONIDE 0.5 % EX CREA: 1.0000 "application " | TOPICAL_CREAM | Freq: Two times a day (BID) | CUTANEOUS | 1 refills | Status: DC

## 2016-08-05 MED ORDER — LEVOCETIRIZINE DIHYDROCHLORIDE 5 MG PO TABS: 5.0000 mg | ORAL_TABLET | Freq: Every evening | ORAL | 3 refills | Status: DC

## 2016-08-05 NOTE — Progress Notes (Signed)
Patient ID: Isabel Isabel [name], female    DOB: [birth year], 51 y.o.   MRN: [id number]  PCP: Patient, No Pcp Per  Chief Complaint  Patient presents with  . Annual Exam    Pap smear    Subjective:   Presents for Isabel Isabel [name]. She is accompanied by her husband.  Cervical Cancer Screening: will perform today Breast Cancer Screening: normal mammogram 3/17. CBE today. Colorectal Cancer Screening: refer today for colonoscopy Bone Density Testing: not yet HIV Screening: complete STI Screening: very low risk Seasonal Influenza Vaccination: annual, current Td/Tdap Vaccination: due Pneumococcal Vaccination: not yet a candidate Zoster Vaccination: not yet   Yesterday her seasonal allergies came on full Isabel [name], with nasal congestion, sneezing and drainage. Some cough. Sore throat. She has run out of Xyzal, which she used briefly before.   Also notes multi-joint pain associated with long shifts stand/walking. When she sits to rest, the pain is worse when she tries to get up again. She hasn't tried taking meloxicam for this, as she thought it was for something else.    Patient Active Problem List   Diagnosis Date Noted  . Migraine 05/15/2016  . Essential hypertension 05/15/2016  . Allergic contact dermatitis due to pollen 05/15/2016  . Muscle spasm 05/15/2016  . Hearing loss 05/15/2016    Past Medical History:  Diagnosis Date  . Environmental and seasonal allergies   . Hypertension   . Migraines      Prior to Admission medications   Medication Sig Start Date End Date Taking? Authorizing Provider  levocetirizine (XYZAL) 5 MG tablet Take 1 tablet (5 mg total) by mouth every evening. 06/18/16  Yes Ofilia Neaslark, Michael L, PA-C  lisinopril (PRINIVIL,ZESTRIL) 20 MG tablet Take 1 tablet (20 mg total) by mouth daily. 05/15/16  Yes Leanore Biggers, PA-C  meloxicam (MOBIC) 7.5 MG tablet Take 1-2 tablets (7.5-15 mg total) by mouth daily. 12/28/15  Yes Sherren MochaShaw, Eva N, MD  tizanidine  (ZANAFLEX) 6 MG capsule  05/15/16  Yes [provider]  topiramate (TOPAMAX) 50 MG tablet Take 1 tablet (50 mg total) by mouth 2 (two) times daily. 05/15/16  Yes Chukwuma Straus, PA-C  triamcinolone cream (KENALOG) 0.5 % Apply 1 application topically 2 (two) times daily. Apply to affected area twice a day as needed. 05/15/16  Yes Porfirio OarJeffery, Demont Linford, PA-C    No Known Allergies  Past Surgical History:  Procedure Laterality Date  . CESAREAN SECTION      Family History  Problem Relation Age of Onset  . Lung cancer Mother   . CAD Father   . Hypertension Father   . Hypertension Sister   . CAD Brother     Social History   Social History  . Marital status: Married    Spouse name: Isabel Isabel [name]  . Number of children: 2  . Years of education: High School   Occupational History  . Server     [employer]   Social History Main Topics  . Smoking status: Never Smoker  . Smokeless tobacco: Never Used  . Alcohol use 0.0 oz/week  . Drug use: No  . Sexual activity: Yes    Partners: Male    Birth control/ protection: None   Other Topics Concern  . None   Social History Narrative   Lives with husband and two adult children.        Review of Systems  Constitutional: Negative.   HENT: Positive for postnasal drip, rhinorrhea, sneezing, sore throat, tinnitus (chronic, RIGHT ear, but  increased), trouble swallowing (due to sore throat) and voice change. Negative for congestion, dental problem, drooling, ear discharge, ear pain, facial swelling, hearing loss, mouth sores, nosebleeds, sinus pain and sinus pressure.   Eyes: Negative.   Respiratory: Positive for cough. Negative for apnea, choking, chest tightness, shortness of breath, wheezing and stridor.   Cardiovascular: Negative.   Gastrointestinal: Negative.   Genitourinary: Negative.   Musculoskeletal: Positive for arthralgias. Negative for back pain, gait problem, joint swelling, myalgias, neck pain and neck stiffness.  Skin:  Positive for rash (episodic, not present currently). Negative for color change, pallor and wound.  Allergic/Immunologic: Positive for environmental allergies. Negative for food allergies and immunocompromised state.  Neurological: Negative.   Hematological: Negative for adenopathy. Bruises/bleeds easily.  Psychiatric/Behavioral: Negative.         Objective:  Physical Exam  Constitutional: She is oriented to person, place, and time. Vital signs are normal. She appears well-developed and well-nourished. She is active and cooperative. No distress.  BP 124/74   Pulse 81   Temp 98.2 F (36.8 C) (Oral)   Resp 18   Ht 4\' 8"  (1.422 m)   Wt 125 lb 6.4 oz (56.9 kg)   SpO2 100%   BMI 28.11 kg/m    HENT:  Head: Normocephalic and atraumatic.  Right Ear: Hearing, tympanic membrane, external ear and ear canal normal. No foreign bodies.  Left Ear: Hearing, tympanic membrane, external ear and ear canal normal. No foreign bodies.  Nose: Mucosal edema present. No rhinorrhea, nose lacerations, sinus tenderness, nasal deformity, septal deviation or nasal septal hematoma. No epistaxis.  No foreign bodies. Right sinus exhibits no maxillary sinus tenderness and no frontal sinus tenderness. Left sinus exhibits no maxillary sinus tenderness and no frontal sinus tenderness.  Mouth/Throat: Uvula is midline, oropharynx is clear and moist and mucous membranes are normal. No oral lesions. Normal dentition. No dental abscesses or uvula swelling. No oropharyngeal exudate.  Eyes: Conjunctivae, EOM and lids are normal. Pupils are equal, round, and reactive to light. Right eye exhibits no discharge. Left eye exhibits no discharge. No scleral icterus.  Fundoscopic exam:      The right eye shows no arteriolar narrowing, no AV nicking, no exudate, no hemorrhage and no papilledema.       The left eye shows no arteriolar narrowing, no AV nicking, no exudate, no hemorrhage and no papilledema.  Neck: Trachea normal, normal  range of motion and full passive range of motion without pain. Neck supple. No spinous process tenderness and no muscular tenderness present. No thyroid mass and no thyromegaly present.  Cardiovascular: Normal rate, regular rhythm, normal heart sounds, intact distal pulses and normal pulses.   Pulmonary/Chest: Effort normal and breath sounds normal. She exhibits no tenderness and no retraction. Right breast exhibits no inverted nipple, no mass, no nipple discharge, no skin change and no tenderness. Left breast exhibits no inverted nipple, no mass, no nipple discharge, no skin change and no tenderness. Breasts are symmetrical.  Abdominal: Soft. Normal appearance and bowel sounds are normal. She exhibits no distension and no mass. There is no hepatosplenomegaly. There is no tenderness. There is no rigidity, no rebound, no guarding, no CVA tenderness, no tenderness at McBurney's point and negative Murphy's sign. No hernia. Hernia confirmed negative in the right inguinal area and confirmed negative in the left inguinal area.  Genitourinary: Rectum normal, vagina normal and uterus normal. Rectal exam shows no external hemorrhoid and no fissure. No breast swelling, tenderness, discharge or bleeding. Pelvic exam was  performed with patient supine. No labial fusion. There is no rash, tenderness, lesion or injury on the right labia. There is no rash, tenderness, lesion or injury on the left labia. Cervix exhibits no motion tenderness, no discharge and no friability. Right adnexum displays no mass, no tenderness and no fullness. Left adnexum displays no mass, no tenderness and no fullness. No erythema, tenderness or bleeding in the vagina. No foreign body in the vagina. No signs of injury around the vagina. No vaginal discharge found.  Musculoskeletal: She exhibits no edema or tenderness.       Cervical back: Normal.       Thoracic back: Normal.       Lumbar back: Normal.  Lymphadenopathy:       Head (right side): No  tonsillar, no preauricular, no posterior auricular and no occipital adenopathy present.       Head (left side): No tonsillar, no preauricular, no posterior auricular and no occipital adenopathy present.    She has no cervical adenopathy.    She has no axillary adenopathy.       Right: No inguinal and no supraclavicular adenopathy present.       Left: No inguinal and no supraclavicular adenopathy present.  Neurological: She is alert and oriented to person, place, and time. She has normal strength and normal reflexes. No cranial nerve deficit. She exhibits normal muscle tone. Coordination and gait normal.  Skin: Skin is warm, dry and intact. No rash noted. She is not diaphoretic. No cyanosis or erythema. Nails show no clubbing.  Psychiatric: She has a normal mood and affect. Her speech is normal and behavior is normal. Judgment and thought content normal.           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 Consider steroid nasal spray if symptoms persist or are inadequately controlled on oral treatment.      Relevant Medications   triamcinolone cream (KENALOG) 0.5 %   levocetirizine (XYZAL) 5 MG tablet   Essential hypertension    Controlled. Continue lisinopril.      Relevant Orders   CBC with Differential/Platelet (Completed)   Comprehensive metabolic panel (Completed)   TSH (Completed)   POCT urinalysis dipstick (Completed)    Other Isabel [name] Diagnoses    Annual physical exam    -  Primary   Age appropriate health guidance provided.   Need for Tdap vaccination       Relevant Orders   Tdap vaccine greater than or equal to 7yo IM (Completed)   Screening for colon cancer       Relevant Orders   Ambulatory referral to Gastroenterology   Screening for cervical cancer       Relevant Orders   Pap IG and HPV (high risk) DNA detection   Screening for  hyperlipidemia       Relevant Orders   Lipid panel (Completed)   Arthralgia, unspecified joint       Try meloxicam PRN for these symptoms.   Relevant Medications   meloxicam (MOBIC) 7.5 MG tablet   Seasonal allergic rhinitis due to pollen       Relevant Medications   Azelastine HCl 0.15 % SOLN       Return in about 6 months (around 02/05/2017) for re-evaluation of blood pressure.   Fernande Bras, PA-C [facility]

## 2016-08-05 NOTE — Patient Instructions (Addendum)
   IF you received an x-ray today, you will receive an invoice from [facility]. Please contact [facility] at [phone] with questions or concerns regarding your invoice.   IF you received labwork today, you will receive an invoice from [facility]. Please contact [facility] at [phone] with questions or concerns regarding your invoice.   Our billing staff will not be able to assist you with questions regarding bills from these companies.  You will be contacted with the lab results as soon as they are available. The fastest way to get your results is to activate your My Chart account. Instructions are located on the last page of this paperwork. If you have not heard from us regarding the results in 2 weeks, please contact this office.     Keeping You Healthy  Get These Tests  Blood Pressure- Have your blood pressure checked by your healthcare provider at least once a year.  Normal blood pressure is 120/80.  Weight- Have your body mass index (BMI) calculated to screen for obesity.  BMI is a measure of body fat based on height and weight.  You can calculate your own BMI at www.nhlbisupport.com/bmi/  Cholesterol- Have your cholesterol checked every year.  Diabetes- Have your blood sugar checked every year if you have high blood pressure, high cholesterol, a family history of diabetes or if you are overweight.  Pap Test - Have a pap test every 1 to 5 years if you have been sexually active.  If you are older than 65 and recent pap tests have been normal you may not need additional pap tests.  In addition, if you have had a hysterectomy  for benign disease additional pap tests are not necessary.  Mammogram-Yearly mammograms are essential for early detection of breast cancer  Screening for Colon Cancer- Colonoscopy starting at age 50. Screening may begin sooner depending on your family history and other health conditions.  Follow up colonoscopy as directed by your  Gastroenterologist.  Screening for Osteoporosis- Screening begins at age 65 with bone density scanning, sooner if you are at higher risk for developing Osteoporosis.  Get these medicines  Calcium with Vitamin D- Your body requires 1200-1500 mg of Calcium a day and 800-1000 IU of Vitamin D a day.  You can only absorb 500 mg of Calcium at a time therefore Calcium must be taken in 2 or 3 separate doses throughout the day.  Hormones- Hormone therapy has been associated with increased risk for certain cancers and heart disease.  Talk to your healthcare provider about if you need relief from menopausal symptoms.  Aspirin- Ask your healthcare provider about taking Aspirin to prevent Heart Disease and Stroke.  Get these Immuniztions  Flu shot- Every fall  Pneumonia shot- Once after the age of 65; if you are younger ask your healthcare provider if you need a pneumonia shot.  Tetanus- Every ten years.  Zostavax- Once after the age of 60 to prevent shingles.  Take these steps  Don't smoke- Your healthcare provider can help you quit. For tips on how to quit, ask your healthcare provider or go to www.smokefree.gov or call 1-800 QUIT-NOW.  Be physically active- Exercise 5 days a week for a minimum of 30 minutes.  If you are not already physically active, start slow and gradually work up to 30 minutes of moderate physical activity.  Try walking, dancing, bike riding, swimming, etc.  Eat a healthy diet- Eat a variety of healthy foods such as fruits, vegetables, whole grains, low   fat milk, low fat cheeses, yogurt, lean meats, chicken, fish, eggs, dried beans, tofu, etc.  For more information go to www.thenutritionsource.org  Dental visit- Brush and floss teeth twice daily; visit your dentist twice a year.  Eye exam- Visit your Optometrist or Ophthalmologist yearly.  Drink alcohol in moderation- Limit alcohol intake to one drink or less a day.  Never drink and drive.  Depression- Your emotional  health is as important as your physical health.  If you're feeling down or losing interest in things you normally enjoy, please talk to your healthcare provider.  Seat Belts- can save your life; always wear one  Smoke/Carbon Monoxide detectors- These detectors need to be installed on the appropriate level of your home.  Replace batteries at least once a year.  Violence- If anyone is threatening or hurting you, please tell your healthcare provider.  Living Will/ Health care power of attorney- Discuss with your healthcare provider and family.  

## 2016-08-06 LAB — COMPREHENSIVE METABOLIC PANEL
A/G RATIO: 1.5 (ref 1.2–2.2)
ALT: 18 IU/L (ref 0–32)
AST: 20 IU/L (ref 0–40)
Albumin: 4.7 g/dL (ref 3.5–5.5)
Alkaline Phosphatase: 50 IU/L (ref 39–117)
BILIRUBIN TOTAL: 0.3 mg/dL (ref 0.0–1.2)
BUN/Creatinine Ratio: 17 (ref 9–23)
BUN: 11 mg/dL (ref 6–24)
CALCIUM: 9.3 mg/dL (ref 8.7–10.2)
CO2: 25 mmol/L (ref 18–29)
Chloride: 100 mmol/L (ref 96–106)
Creatinine, Ser: 0.64 mg/dL (ref 0.57–1.00)
GFR, EST AFRICAN AMERICAN: 119 mL/min/{1.73_m2} (ref >59)
GFR, EST NON AFRICAN AMERICAN: 104 mL/min/{1.73_m2} (ref >59)
GLOBULIN, TOTAL: 3.1 g/dL (ref 1.5–4.5)
Glucose: 83 mg/dL (ref 65–99)
POTASSIUM: 4.1 mmol/L (ref 3.5–5.2)
SODIUM: 142 mmol/L (ref 134–144)
TOTAL PROTEIN: 7.8 g/dL (ref 6.0–8.5)

## 2016-08-06 LAB — CBC WITH DIFFERENTIAL/PLATELET
BASOS: 1 %
Basophils Absolute: 0 10*3/uL (ref 0.0–0.2)
EOS (ABSOLUTE): 0.2 10*3/uL (ref 0.0–0.4)
EOS: 5 %
HEMATOCRIT: 40.1 % (ref 34.0–46.6)
Hemoglobin: 13.6 g/dL (ref 11.1–15.9)
Immature Grans (Abs): 0 10*3/uL (ref 0.0–0.1)
Immature Granulocytes: 0 %
Lymphocytes Absolute: 1.1 10*3/uL (ref 0.7–3.1)
Lymphs: 28 %
MCH: 31.2 pg (ref 26.6–33.0)
MCHC: 33.9 g/dL (ref 31.5–35.7)
MCV: 92 fL (ref 79–97)
MONOS ABS: 0.6 10*3/uL (ref 0.1–0.9)
Monocytes: 16 %
NEUTROS ABS: 2 10*3/uL (ref 1.4–7.0)
Neutrophils: 50 %
Platelets: 259 10*3/uL (ref 150–379)
RBC: 4.36 x10E6/uL (ref 3.77–5.28)
RDW: 14.1 % (ref 12.3–15.4)
WBC: 4 10*3/uL (ref 3.4–10.8)

## 2016-08-06 LAB — LIPID PANEL
CHOL/HDL RATIO: 2.5 ratio (ref 0.0–4.4)
Cholesterol, Total: 202 mg/dL — ABNORMAL HIGH (ref 100–199)
HDL: 81 mg/dL (ref >39)
LDL Calculated: 111 mg/dL — ABNORMAL HIGH (ref 0–99)
Triglycerides: 52 mg/dL (ref 0–149)
VLDL Cholesterol Cal: 10 mg/dL (ref 5–40)

## 2016-08-06 LAB — TSH: TSH: 1.38 u[IU]/mL (ref 0.450–4.500)

## 2016-08-06 NOTE — Assessment & Plan Note (Signed)
Resume oral antihistamine and add nasal spray. Consider steroid nasal spray if symptoms persist or are inadequately controlled on oral treatment.

## 2016-08-06 NOTE — Assessment & Plan Note (Signed)
Controlled.  Continue lisinopril 

## 2016-09-03 LAB — PAP IG AND HPV HIGH-RISK
HPV, HIGH-RISK: POSITIVE — AB
PAP Smear Comment: 0

## 2016-09-05 ENCOUNTER — Encounter: Payer: Self-pay | Admitting: Physician Assistant

## 2017-02-06 ENCOUNTER — Ambulatory Visit: Payer: Managed Care, Other (non HMO) | Admitting: Physician Assistant

## 2017-02-22 IMAGING — DX DG CHEST 2V
2 series · 2 of 2 positions shown · non-contrast
Comparison: None

CLINICAL DATA: Chest pain

EXAM:
CHEST  2 VIEW

[chest pa]
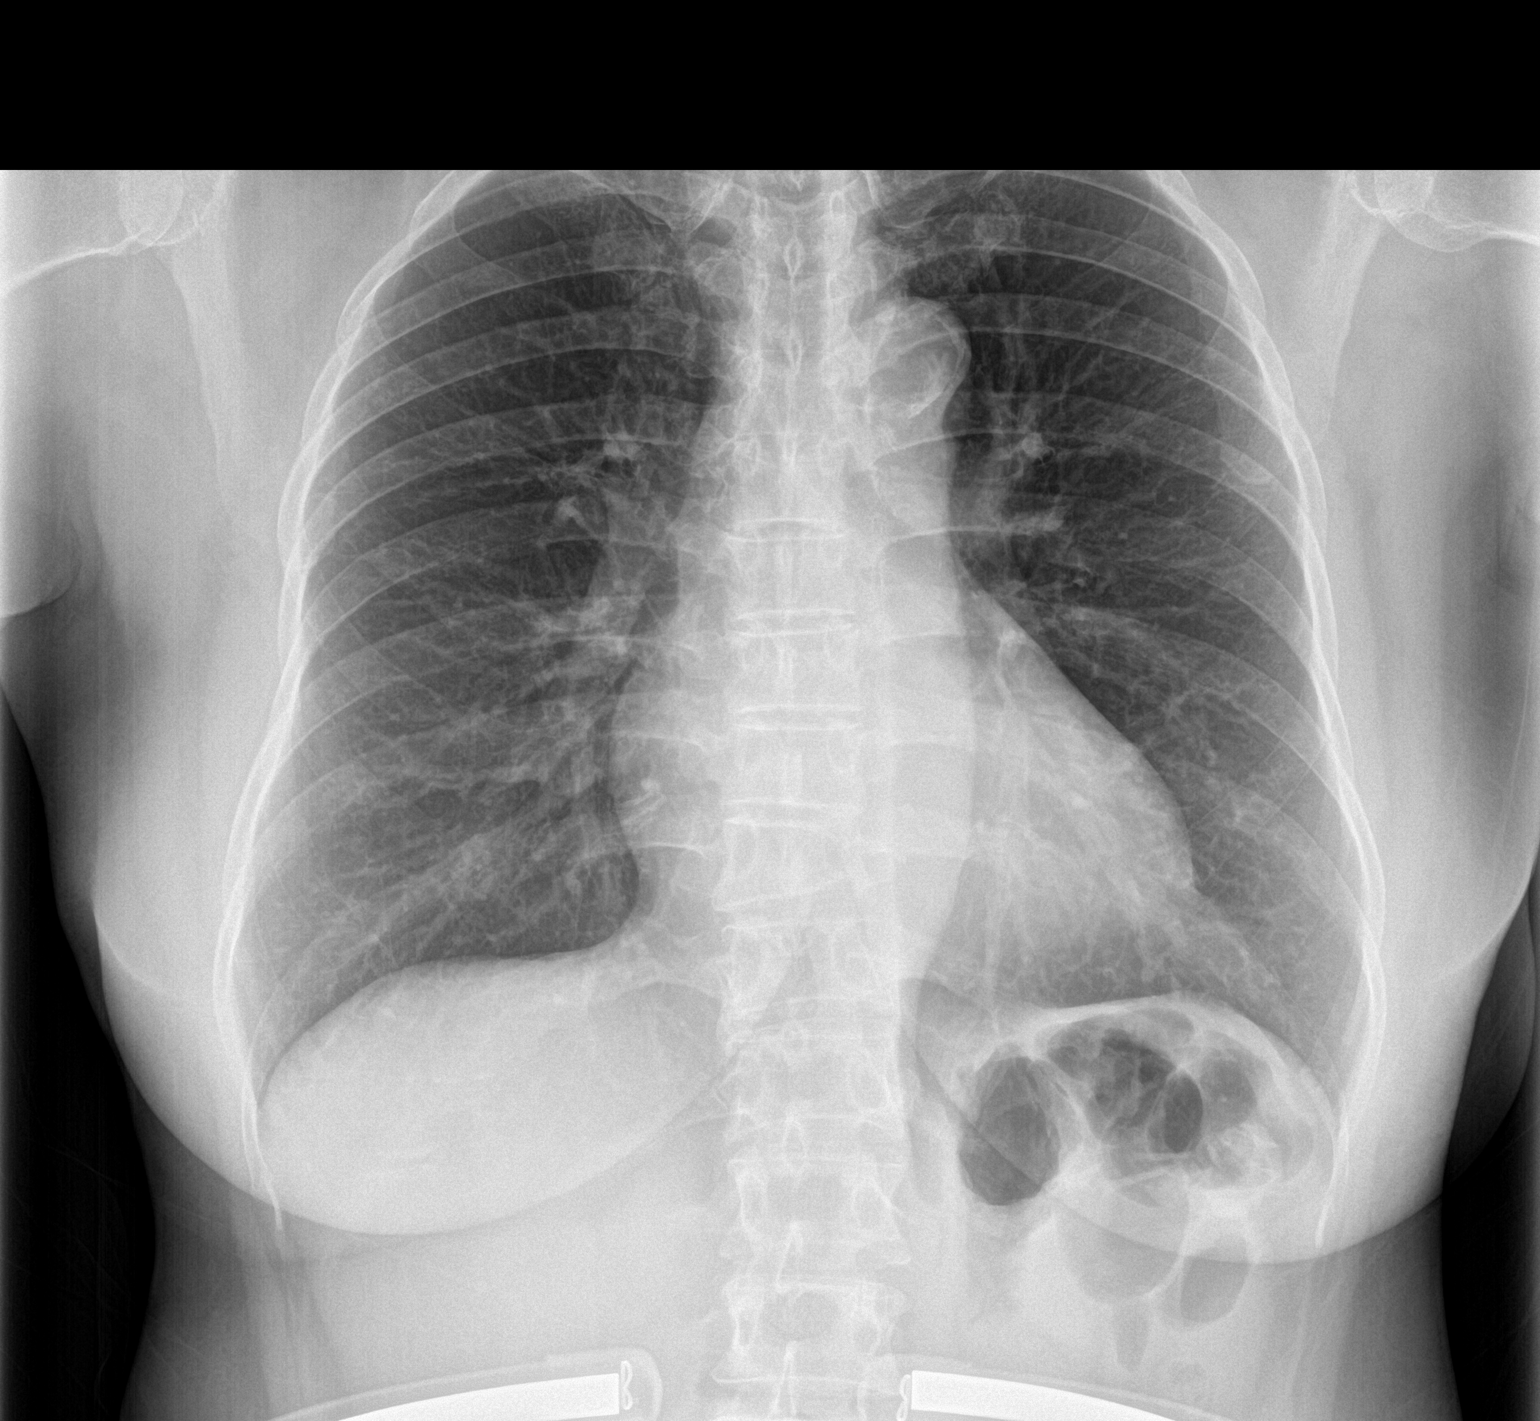

[chest lat]
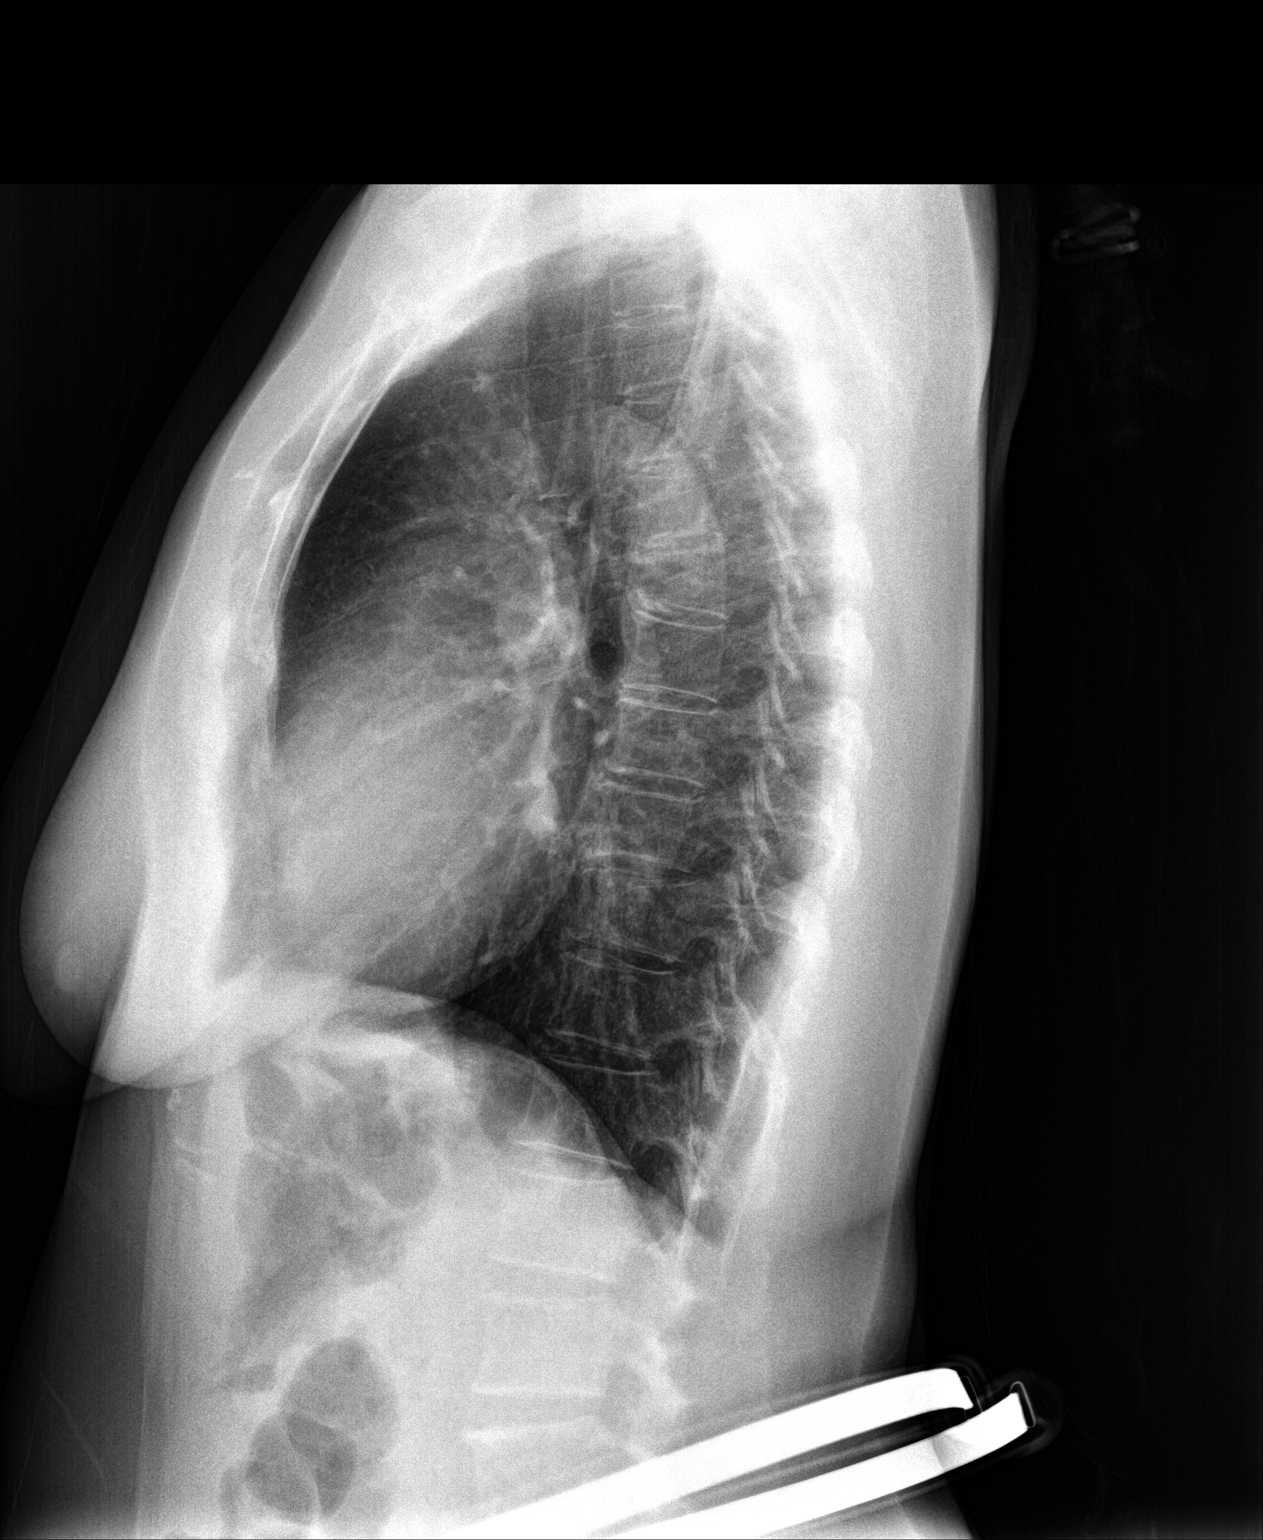

[2 of 2 positions shown; findings below may reference images not displayed]

FINDINGS: The heart size and mediastinal contours are within normal limits.
Aortic atherosclerosis. Both lungs are clear. The visualized
skeletal structures are unremarkable.
IMPRESSION: No active cardiopulmonary disease.

## 2017-04-16 ENCOUNTER — Telehealth: Payer: Self-pay | Admitting: Physician Assistant

## 2017-04-16 DIAGNOSIS — L237 Allergic contact dermatitis due to plants, except food: Secondary | ICD-10-CM

## 2017-04-17 NOTE — Telephone Encounter (Signed)
Pt requesting refill. Last refill for triamcinolone cream was 08/05/16. Pt has not been seen since 08/05/16 and canceled last office visit on 02/06/17.

## 2017-04-17 NOTE — Telephone Encounter (Signed)
Husband calling and checking status. Please advise.  Also requesting 90 day supply lisinopril (PRINIVIL,ZESTRIL) 20 MG tablet

## 2017-04-20 MED ORDER — TRIAMCINOLONE ACETONIDE 0.5 % EX CREA
1.0000 | TOPICAL_CREAM | Freq: Two times a day (BID) | CUTANEOUS | 0 refills | Status: DC
Start: 2017-04-20 — End: 2017-05-08

## 2017-04-20 NOTE — Telephone Encounter (Signed)
Refilled the triamcinolone cream. The lisinopril was written 05/15/2016 for a year supply. She should have about 3 weeks' worth left. OK to authorize a 30-day supply, but if she can be seen before she runs out, 90-day supply will be appropriate.

## 2017-04-20 NOTE — Telephone Encounter (Signed)
Pls advise. Pt has no up coming apptmnts.

## 2017-04-20 NOTE — Addendum Note (Signed)
Addended by: Fernande BrasJEFFERY, Maree Ainley S on: 04/20/2017 01:37 PM   Modules accepted: Orders

## 2017-04-21 ENCOUNTER — Telehealth: Payer: Self-pay

## 2017-04-21 DIAGNOSIS — I10 Essential (primary) hypertension: Secondary | ICD-10-CM

## 2017-04-21 MED ORDER — LISINOPRIL 20 MG PO TABS: 20.0000 mg | ORAL_TABLET | Freq: Every day | ORAL | 0 refills | Status: DC

## 2017-04-21 NOTE — Telephone Encounter (Signed)
Sign Encounter 

## 2017-04-21 NOTE — Telephone Encounter (Signed)
Lisinopril Sent for 90 day supply.

## 2017-05-08 ENCOUNTER — Ambulatory Visit (INDEPENDENT_AMBULATORY_CARE_PROVIDER_SITE_OTHER): Payer: Managed Care, Other (non HMO) | Admitting: Physician Assistant

## 2017-05-08 ENCOUNTER — Encounter: Payer: Self-pay | Admitting: Physician Assistant

## 2017-05-08 ENCOUNTER — Other Ambulatory Visit: Payer: Self-pay

## 2017-05-08 VITALS — HR 85 | Temp 98.5°F | Ht 58.75 in | Wt 113.0 lb

## 2017-05-08 DIAGNOSIS — S46811A Strain of other muscles, fascia and tendons at shoulder and upper arm level, right arm, initial encounter: Secondary | ICD-10-CM

## 2017-05-08 DIAGNOSIS — L237 Allergic contact dermatitis due to plants, except food: Secondary | ICD-10-CM | POA: Diagnosis not present

## 2017-05-08 DIAGNOSIS — I10 Essential (primary) hypertension: Secondary | ICD-10-CM | POA: Diagnosis not present

## 2017-05-08 DIAGNOSIS — Z23 Encounter for immunization: Secondary | ICD-10-CM

## 2017-05-08 DIAGNOSIS — G43109 Migraine with aura, not intractable, without status migrainosus: Secondary | ICD-10-CM

## 2017-05-08 DIAGNOSIS — J301 Allergic rhinitis due to pollen: Secondary | ICD-10-CM | POA: Diagnosis not present

## 2017-05-08 DIAGNOSIS — M2550 Pain in unspecified joint: Secondary | ICD-10-CM

## 2017-05-08 MED ORDER — LEVOCETIRIZINE DIHYDROCHLORIDE 5 MG PO TABS: 5.0000 mg | ORAL_TABLET | Freq: Every evening | ORAL | 3 refills | Status: AC

## 2017-05-08 MED ORDER — TIZANIDINE HCL 6 MG PO CAPS: 6.0000 mg | ORAL_CAPSULE | Freq: Every day | ORAL | 3 refills | Status: AC

## 2017-05-08 MED ORDER — TRIAMCINOLONE ACETONIDE 0.5 % EX CREA: 1.0000 "application " | TOPICAL_CREAM | Freq: Two times a day (BID) | CUTANEOUS | 1 refills | Status: AC

## 2017-05-08 MED ORDER — LISINOPRIL 20 MG PO TABS: 20.0000 mg | ORAL_TABLET | Freq: Every day | ORAL | 3 refills | Status: AC

## 2017-05-08 MED ORDER — TOPIRAMATE 100 MG PO TABS: 100.0000 mg | ORAL_TABLET | Freq: Two times a day (BID) | ORAL | 3 refills | Status: AC

## 2017-05-08 MED ORDER — AZELASTINE HCL 0.15 % NA SOLN: 2.0000 | Freq: Two times a day (BID) | NASAL | 99 refills | Status: AC

## 2017-05-08 NOTE — Patient Instructions (Addendum)
STOP the Advil. DO NOT take the meloxicam. Avoid Tylenol. DO not use the RIGHT arm to lift items over 10 lbs, and don't lift anything over your head with the RIGHT arm.  INCREASE the topiramate to 100 mg twice each day. Continue the tizanidine at bedtime.  If your should pain is not improved in 2 weeks, let me know.  Dry your hands completely after washing. Apply a moisturizer frequently. I like Cetaphil. Aquaphor, Moisturel and Lubriderm are also good products.  Please make healthy eating choices and get regular exercise.  Please call [facility] to schedule your screening colonoscopy.   IF you received an x-ray today, you will receive an [facility]. Please contact [facility] at [phone] with questions or concerns regarding your invoice.   IF you received labwork today, you will receive an invoice from [facility]. Please contact [facility] at [phone] with questions or concerns regarding your invoice.   Our billing staff will not be able to assist you with questions regarding bills from these companies.  You will be contacted with the lab results as soon as they are available. The fastest way to get your results is to activate your My Chart account. Instructions are located on the last page of this paperwork. If you have not heard from us regarding the results in 2 weeks, please contact this office.

## 2017-05-08 NOTE — Progress Notes (Signed)
Patient ID: Isabel [name], female    DOB: [birth year], 52 y.o.   MRN: [id number]  PCP: Patient, No Pcp Per  Chief Complaint  Patient presents with  . Shoulder Pain    R/sided pain x 3 weeks. Occupation: 8hrsxLifting heavy trays  . Medication Refill    lisinopril, Azelastine, Kenalog cream, meloxicam for chest pain,  Xyzal, Zanaflex/Topamax for HA x 90 day supply req.  Marland Kitchen Flu Vaccine    Subjective:   Presents for evaluation of hypertension, headache, muscle spasm.  She needs medication refills and flu vaccine today.  In addition she reports right-sided shoulder pain for the last 3 weeks.  She is accompanied by her husband.  Shoulder pain is progressively worsening.  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  She lifts very heavy trays over her head to carry to the tables.  She is right-hand dominant..  Headache persists. Daily. She is tolerating topiramate and tizanidine, and notes that it's better than before. Typically HA are early AM or late evening. Thinks that stress related.   Uses meloxicam daily for her various muscle pains and headache.    Review of Systems As above. No chest pain, shortness of breath, blurred vision, dizziness. No weakness, not dropping things.  No paresthesias. No nausea, vomiting, diarrhea, constipation. No fevers, chills. No urinary symptoms.    Patient Active Problem List   Diagnosis Date Noted  . Migraine 05/15/2016  . Essential hypertension 05/15/2016  . Allergic contact dermatitis due to pollen 05/15/2016  . Muscle spasm 05/15/2016  . Hearing loss 05/15/2016     Prior to Admission medications   Medication Sig Start Date End Date Taking? Authorizing Provider  Azelastine HCl 0.15 % SOLN Place 2 sprays into both nostrils 2 (two) times daily. 08/05/16  Yes Dariel Pellecchia, PA-C  levocetirizine (XYZAL) 5 MG tablet Take 1 tablet (5 mg total) by mouth every evening. 08/05/16  Yes Lynix Bonine, PA-C  lisinopril  (PRINIVIL,ZESTRIL) 20 MG tablet Take 1 tablet (20 mg total) by mouth daily. 04/21/17  Yes Jareb Radoncic, PA-C  meloxicam (MOBIC) 7.5 MG tablet Take 1-2 tablets (7.5-15 mg total) by mouth daily. 08/05/16  Yes Anai Lipson, PA-C  tizanidine (ZANAFLEX) 6 MG capsule  05/15/16  Yes [provider]  topiramate (TOPAMAX) 50 MG tablet Take 1 tablet (50 mg total) by mouth 2 (two) times daily. 05/15/16  Yes Demarko Zeimet, PA-C  triamcinolone cream (KENALOG) 0.5 % Apply 1 application topically 2 (two) times daily. Apply to affected area twice a day as needed. 04/20/17  Yes Anayely Constantine, PA-C     No Known Allergies     Objective:  Physical Exam  Constitutional: She is oriented to person, place, and time. She appears well-developed and well-nourished. She is active and cooperative. No distress.  Pulse 85   Temp 98.5 F (36.9 C) (Oral)   Ht 4' 10.75" (1.492 m)   Wt 113 lb (51.3 kg)   SpO2 98%   BMI 23.02 kg/m   HENT:  Head: Normocephalic and atraumatic.  Right Ear: Hearing normal.  Left Ear: Hearing normal.  Eyes: Conjunctivae are normal. No scleral icterus.  Neck: Normal range of motion. Neck supple. No thyromegaly present.  Cardiovascular: Normal rate, regular rhythm and normal heart sounds.  Pulses:      Radial pulses are 2+ on the right side, and 2+ on the left side.  Pulmonary/Chest: Effort normal and breath sounds normal.  Musculoskeletal:  Cervical back: Normal.       Arms: Lymphadenopathy:       Head (right side): No tonsillar, no preauricular, no posterior auricular and no occipital adenopathy present.       Head (left side): No tonsillar, no preauricular, no posterior auricular and no occipital adenopathy present.    She has no cervical adenopathy.       Right: No supraclavicular adenopathy present.       Left: No supraclavicular adenopathy present.  Neurological: She is alert and oriented to person, place, and time. She has normal reflexes. No cranial nerve  deficit or sensory deficit. Coordination normal.  Skin: Skin is warm, dry and intact. No rash noted. No cyanosis or erythema. Nails show no clubbing.  Psychiatric: She has a normal mood and affect. Her speech is normal and behavior is normal.           Assessment & Plan:   Problem List Items Addressed This Visit    Migraine (Chronic)    Much improved with topiramate and tizanidine.  Unfortunately it is also likely aggravated by regular use of meloxicam.  Try to eliminate meloxicam, at least temporarily.  She may need to resume it for muscle and joint pain. Increase topiramate from 50 mg to 100 mg.      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  Counseled on need to take a 7-day break every 14 days of use.      Relevant Medications   triamcinolone cream (KENALOG) 0.5 %   levocetirizine (XYZAL) 5 MG tablet   Essential hypertension    Blood pressure not documented in today's vitals.      Relevant Medications   lisinopril (PRINIVIL,ZESTRIL) 20 MG tablet    Other Visit Diagnoses    Strain of right trapezius muscle, initial encounter    -  Primary   Rest.  Note for work to minimize use of right arm.  Continue tizanidine.  Warm compresses.   Seasonal allergic rhinitis due to pollen       Relevant Medications   Azelastine HCl 0.15 % SOLN   Arthralgia, unspecified joint       Hope that minimizing use of the right arm will help, regular use of meloxicam is likely exacerbating her migraine.  She may need to resume meloxicam.   Flu vaccine need       Relevant Orders   Flu Vaccine QUAD 36+ mos IM (Completed)       Return in about 3 months (around 08/05/2017), or if symptoms worsen or fail to improve, for annual exam, fasting labs and pap test.   Fernande Brashelle S. Parvin Stetzer, PA-C Primary Care at  [facility]

## 2017-05-27 NOTE — Assessment & Plan Note (Signed)
Blood pressure not documented in today's vitals.

## 2017-05-27 NOTE — Assessment & Plan Note (Signed)
Refilled triamcinolone cream.  Counseled on need to take a 7-day break every 14 days of use.

## 2017-05-27 NOTE — Assessment & Plan Note (Addendum)
Much improved with topiramate and tizanidine.  Unfortunately it is also likely aggravated by regular use of meloxicam.  Try to eliminate meloxicam, at least temporarily.  She may need to resume it for muscle and joint pain. Increase topiramate from 50 mg to 100 mg.

## 2017-06-03 ENCOUNTER — Other Ambulatory Visit: Payer: Self-pay

## 2017-06-03 ENCOUNTER — Encounter: Payer: Self-pay | Admitting: Family Medicine

## 2017-06-03 ENCOUNTER — Ambulatory Visit (INDEPENDENT_AMBULATORY_CARE_PROVIDER_SITE_OTHER): Payer: Self-pay | Admitting: Family Medicine

## 2017-06-03 ENCOUNTER — Telehealth: Payer: Self-pay | Admitting: Family Medicine

## 2017-06-03 VITALS — BP 122/76 | HR 85 | Temp 98.7°F | Resp 16 | Ht 58.75 in | Wt 112.2 lb

## 2017-06-03 DIAGNOSIS — H7291 Unspecified perforation of tympanic membrane, right ear: Secondary | ICD-10-CM

## 2017-06-03 NOTE — Patient Instructions (Addendum)
   IF you received an x-ray today, you will receive an invoice from [facility]. Please contact [facility] at [phone] with questions or concerns regarding your invoice.   IF you received labwork today, you will receive an invoice from [facility]. Please contact [facility] at [phone] with questions or concerns regarding your invoice.   Our billing staff will not be able to assist you with questions regarding bills from these companies.  You will be contacted with the lab results as soon as they are available. The fastest way to get your results is to activate your My Chart account. Instructions are located on the last page of this paperwork. If you have not heard from us regarding the results in 2 weeks, please contact this office.     Eardrum Rupture, Adult An eardrum rupture is a hole (perforation) in the eardrum. The eardrum is a thin, round tissue inside of the ear that separates the ear canal from the middle ear. The eardrum is also called the tympanic membrane. It transfers sound vibrations through small bones in the middle ear to the hearing nerve in the inner ear. It also protects the middle ear from germs. An eardrum rupture can cause pain and hearing loss. What are the causes? This condition may be caused by:  An infection.  A sudden injury, such as from: ? Inserting a thin, sharp object into the ear. ? A hit to the side of the head, especially by an open hand. ? Falling onto water or a flat surface. ? A rapid change in pressure, such as from flying or scuba diving. ? A sudden increase in pressure against the eardrum, such as from an explosion or a very loud noise.  Inserting a cotton-tipped swab in the ear.  A long-term eustachian tube disorder. Eustachian tubes are parts of the body that connect each middle ear space to the back of the nose.  A medical procedure or surgery, such as a procedure to remove wax from the ear canal.  Removing a  man-made pressure equalization tube(PE tube) that was placed through the eardrum.  Having a PE tube fall out.  What increases the risk? You are more likely to develop this condition if:  You have had PE tubes inserted in your ears.  You have an ear infection.  You play sports that: ? Involve balls or contact with other players. ? Take place in water, such as diving, scuba diving, or waterskiing.  What are the signs or symptoms? Symptoms of this condition include:  Sudden pain at the time of the injury.  Ear pain that suddenly improves.  Ringing in the ear after the injury.  Drainage from the ear. The drainage may be clear, cloudy or pus-like, or bloody.  Hearing loss.  Dizziness.  How is this diagnosed? This condition is diagnosed based on your symptoms and medical history as well as a physical exam. Your health care provider can usually see a perforation using an ear scope (otoscope). You may have tests, such as:  A hearing test (audiogram) to check for hearing loss.  A test in which a sample of ear drainage is tested for infection (culture).  How is this treated? An eardrum typically heals on its own within a few weeks. If your eardrum does not heal, your health care provider may recommend a procedure to place a patch over your eardrum or surgery to repair your eardrum. Your health care provider may also prescribe antibiotic medicines to help prevent infection.   If the ear heals completely, any hearing loss should be temporary. Follow these instructions at home:  Keep your ear dry. This is very important. Follow instructions from your health care provider about how to keep your ear dry. You may need to wear waterproof earplugs when bathing and swimming.  Take over-the-counter and prescription medicines only as told by your health care provider.  Return to sports and activities as told by your health care provider. Ask your health care provider what activities are safe  for you.  Wear headgear with ear protection when you play sports in which ear injuries are common.  If directed, apply heat to your affected ear as often as told by your health care provider. Use the heat source that your health care provider recommends, such as a moist heat pack or a heating pad. This will help to relieve pain. ? Place a towel between your skin and the heat source. ? Leave the heat on for 20-30 minutes. ? Remove the heat if your skin turns bright red. This is especially important if you are unable to feel pain, heat, or cold. You may have a greater risk of getting burned.  Keep all follow-up visits as told by your health care provider. This is important.  Talk to your health care provider before traveling by plane. Contact a health care provider if:  You have mucus or blood draining from your ear.  You have a fever.  You have ear pain.  You have hearing loss, dizziness, or ringing in your ear. Get help right away if:  You have sudden hearing loss.  You are very dizzy.  You have severe ear pain.  Your face feels weak or becomes paralyzed. Summary  An eardrum rupture is a hole (perforation) in the eardrum that can cause pain and hearing loss. It is usually caused by a sudden injury to the ear.  The eardrum will likely heal on its own within a few weeks. In some cases, surgery may be necessary.  After the injury, follow instructions from your health care provider about how to keep your ear dry as it heals. This information is not intended to replace advice given to you by your health care provider. Make sure you discuss any questions you have with your health care provider. Document Released: 03/14/2000 Document Revised: 05/23/2016 Document Reviewed: 05/23/2016 Elsevier Interactive Patient Education  2018 [employer]  

## 2017-06-03 NOTE — Progress Notes (Signed)
Chief Complaint  Patient presents with  . right ear bleeding    starting this morning while at work, pain in ear last night.  This is new as she has had a hx with trouble with the right ear  but never any bleeding .    HPI  Pt reports that this morning she felt liquid running from her right ear She normally cleans her ear with a qtip but did not do so today She denies dizziness, pain, fevers or chills She does not take any aspirins   4 review of systems  Past Medical History:  Diagnosis Date  . Environmental and seasonal allergies   . Hypertension   . Migraines     Current Outpatient Medications  Medication Sig Dispense Refill  . Azelastine HCl 0.15 % SOLN Place 2 sprays into both nostrils 2 (two) times daily. 30 mL prn  . levocetirizine (XYZAL) 5 MG tablet Take 1 tablet (5 mg total) by mouth every evening. 90 tablet 3  . lisinopril (PRINIVIL,ZESTRIL) 20 MG tablet Take 1 tablet (20 mg total) by mouth daily. 90 tablet 3  . meloxicam (MOBIC) 7.5 MG tablet Take 1-2 tablets (7.5-15 mg total) by mouth daily. 60 tablet 1  . tizanidine (ZANAFLEX) 6 MG capsule Take 1 capsule (6 mg total) by mouth at bedtime. 90 capsule 3  . topiramate (TOPAMAX) 100 MG tablet Take 1 tablet (100 mg total) by mouth 2 (two) times daily. 180 tablet 3  . triamcinolone cream (KENALOG) 0.5 % Apply 1 application topically 2 (two) times daily. May use 7 days on, then 7 days off 30 g 1   No current facility-administered medications for this visit.     Allergies: No Known Allergies  Past Surgical History:  Procedure Laterality Date  . CESAREAN SECTION      Social History   Socioeconomic History  . Marital status: Married    Spouse name: Fayrene Fearing  . Number of children: 2  . Years of education: [employer]  . Highest education level: None  Social Needs  . Financial resource strain: None  . Food insecurity - worry: None  . Food insecurity - inability: None  . Transportation needs - medical: None  .  Transportation needs - non-medical: None  Occupational History  . Occupation: Server    Comment: Customer service manager  Tobacco Use  . Smoking status: Never Smoker  . Smokeless tobacco: Never Used  Substance and Sexual Activity  . Alcohol use: Yes    Alcohol/week: 0.0 oz  . Drug use: No  . Sexual activity: Yes    Partners: Male    Birth control/protection: None  Other Topics Concern  . None  Social History Narrative   Lives with husband and two adult children.     Family History  Problem Relation Age of Onset  . Lung cancer Mother   . CAD Father   . Hypertension Father   . Hypertension Sister   . CAD Brother      ROS Review of Systems See HPI Constitution: No fevers or chills No malaise No diaphoresis Skin: No rash or itching Eyes: no blurry vision, no double vision GU: no dysuria or hematuria Neuro: no dizziness or headaches * all others reviewed and negative   Objective: Vitals:   06/03/17 0846  BP: 122/76  Pulse: 85  Resp: 16  Temp: 98.7 F (37.1 C)  TempSrc: Oral  SpO2: 100%  Weight: 112 lb 3.2 oz (50.9 kg)  Height: 4' 10.75" (1.492 m)  Physical Exam General: alert, oriented, in NAD Head: normocephalic, atraumatic, no sinus tenderness Eyes: EOM intact, no scleral icterus or conjunctival injection Ears: TM clear on left, right TM absent, visible blood in the ear canal Nose: mucosa nonerythematous, nonedematous Throat: no pharyngeal exudate or erythema Lungs: normal pulmonary effort  Assessment and Plan Isabel [name] was seen today for right ear bleeding.  Diagnoses and all orders for this visit:  Ruptured tympanic membrane, right-  Advised tylenol for pain, keep the ear covered and dry Follow up with ENT  -     Ambulatory referral to ENT     Joanthony Hamza A Creta LevinStallings

## 2017-06-03 NOTE — Telephone Encounter (Signed)
Called Dr. Allene PyoNewman's office to see if we could get pt in for urgent appt from referral. They have scheduled the pt for 2:45pm on 06/04/17. I spoke with the pt's husband and advised him of this. The pt and her husband wanted to know if they had anything earlier tomorrow, so I gave them Dr. Allene PyoNewman's phone number and advised that they can call and check. Pt's husband said he would call just to see. Thanks!

## 2017-06-05 ENCOUNTER — Ambulatory Visit: Payer: Self-pay | Admitting: Physician Assistant

## 2017-07-06 ENCOUNTER — Encounter: Payer: Self-pay | Admitting: Physician Assistant

## 2017-08-07 ENCOUNTER — Ambulatory Visit: Payer: Managed Care, Other (non HMO) | Admitting: Physician Assistant

## 2017-08-24 ENCOUNTER — Encounter: Payer: Self-pay | Admitting: Family Medicine

## 2019-01-14 ENCOUNTER — Other Ambulatory Visit: Payer: Self-pay | Admitting: Physician Assistant

## 2019-01-14 DIAGNOSIS — Z1231 Encounter for screening mammogram for malignant neoplasm of breast: Secondary | ICD-10-CM

## 2020-06-21 ENCOUNTER — Other Ambulatory Visit: Payer: Self-pay | Admitting: Otolaryngology

## 2020-06-21 DIAGNOSIS — H9311 Tinnitus, right ear: Secondary | ICD-10-CM

## 2020-06-21 DIAGNOSIS — H6983 Other specified disorders of Eustachian tube, bilateral: Secondary | ICD-10-CM

## 2020-06-21 DIAGNOSIS — H73899 Other specified disorders of tympanic membrane, unspecified ear: Secondary | ICD-10-CM

## 2020-06-21 DIAGNOSIS — H900 Conductive hearing loss, bilateral: Secondary | ICD-10-CM

## 2020-07-16 ENCOUNTER — Ambulatory Visit
Admission: RE | Admit: 2020-07-16 | Discharge: 2020-07-16 | Disposition: A | Discharge status: Home / Self Care | Payer: Commercial Managed Care - PPO | Source: Ambulatory Visit | Attending: Otolaryngology | Admitting: Otolaryngology

## 2020-07-16 DIAGNOSIS — H73899 Other specified disorders of tympanic membrane, unspecified ear: Secondary | ICD-10-CM

## 2020-07-16 DIAGNOSIS — H900 Conductive hearing loss, bilateral: Secondary | ICD-10-CM

## 2020-07-16 DIAGNOSIS — H6983 Other specified disorders of Eustachian tube, bilateral: Secondary | ICD-10-CM

## 2020-07-16 DIAGNOSIS — H9311 Tinnitus, right ear: Secondary | ICD-10-CM

## 2020-07-26 ENCOUNTER — Other Ambulatory Visit: Payer: Self-pay | Admitting: Otolaryngology

## 2020-07-26 DIAGNOSIS — E236 Other disorders of pituitary gland: Secondary | ICD-10-CM

## 2020-08-13 ENCOUNTER — Ambulatory Visit
Admission: RE | Admit: 2020-08-13 | Discharge: 2020-08-13 | Disposition: A | Discharge status: Home / Self Care | Payer: Commercial Managed Care - PPO | Source: Ambulatory Visit | Attending: Otolaryngology | Admitting: Otolaryngology

## 2020-08-13 ENCOUNTER — Other Ambulatory Visit: Payer: Self-pay

## 2020-08-13 DIAGNOSIS — E236 Other disorders of pituitary gland: Secondary | ICD-10-CM

## 2020-08-13 MED ORDER — GADOBENATE DIMEGLUMINE 529 MG/ML IV SOLN
7.0000 mL | Freq: Once | INTRAVENOUS | Status: AC | PRN
  Administered 2020-08-13: 7 mL via INTRAVENOUS
